# Patient Record
Sex: Male | Born: 1965 | Hispanic: Yes | Smoking: Never smoker
Health system: Southern US, Community
[De-identification: ages and names within clinical notes are randomized; demographics above are authoritative.]

## PROBLEM LIST (undated history)

## (undated) HISTORY — PX: APPENDECTOMY: SHX54

---

## 2001-11-03 DIAGNOSIS — K37 Unspecified appendicitis: Secondary | ICD-10-CM

## 2001-11-03 HISTORY — DX: Unspecified appendicitis: K37

## 2016-06-03 ENCOUNTER — Telehealth: Payer: Self-pay | Admitting: Cardiology

## 2016-06-03 NOTE — Telephone Encounter (Signed)
Encounter not needed

## 2016-06-03 NOTE — Telephone Encounter (Signed)
Allred is calling from [facility] ( [address]) needs clarification on the directions for  Generic Crestor . Please call °  °Thanks   ° °

## 2016-06-03 NOTE — Telephone Encounter (Signed)
No documentation that any provider in the system has seen this pt.  No medications listed, no appts scheduled or past. ? Correct pt

## 2016-06-13 ENCOUNTER — Emergency Department
Admission: EM | Admit: 2016-06-13 | Discharge: 2016-06-13 | Disposition: A | Discharge status: Home / Self Care | Payer: Worker's Compensation | Attending: Emergency Medicine | Admitting: Emergency Medicine

## 2016-06-13 ENCOUNTER — Emergency Department: Payer: Worker's Compensation

## 2016-06-13 ENCOUNTER — Encounter: Payer: Self-pay | Admitting: Emergency Medicine

## 2016-06-13 DIAGNOSIS — W010XXA Fall on same level from slipping, tripping and stumbling without subsequent striking against object, initial encounter: Secondary | ICD-10-CM | POA: Insufficient documentation

## 2016-06-13 DIAGNOSIS — Y929 Unspecified place or not applicable: Secondary | ICD-10-CM | POA: Diagnosis not present

## 2016-06-13 DIAGNOSIS — Y9389 Activity, other specified: Secondary | ICD-10-CM | POA: Insufficient documentation

## 2016-06-13 DIAGNOSIS — S52501A Unspecified fracture of the lower end of right radius, initial encounter for closed fracture: Secondary | ICD-10-CM

## 2016-06-13 DIAGNOSIS — S52591A Other fractures of lower end of right radius, initial encounter for closed fracture: Secondary | ICD-10-CM | POA: Insufficient documentation

## 2016-06-13 DIAGNOSIS — M25531 Pain in right wrist: Secondary | ICD-10-CM | POA: Diagnosis present

## 2016-06-13 DIAGNOSIS — Y990 Civilian activity done for income or pay: Secondary | ICD-10-CM | POA: Diagnosis not present

## 2016-06-13 MED ORDER — OXYCODONE-ACETAMINOPHEN 5-325 MG PO TABS: 1.0000 | ORAL_TABLET | Freq: Four times a day (QID) | ORAL | 0 refills | Status: DC | PRN

## 2016-06-13 MED ORDER — OXYCODONE-ACETAMINOPHEN 5-325 MG PO TABS
1.0000 | ORAL_TABLET | Freq: Once | ORAL | Status: AC
  Administered 2016-06-13: 1 via ORAL
  Filled 2016-06-13: qty 1

## 2016-06-13 NOTE — ED Triage Notes (Signed)
Patient presents to the ED with right wrist pain after slipping and falling on a ramp.  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  Denies hitting his head or any other injury.  Patient holding wrist during triage.

## 2016-06-13 NOTE — ED Provider Notes (Signed)
[facility] Emergency Department Provider Note  ____________________________________________  Time seen: Approximately 6:32 PM  I have reviewed the triage vital signs and the nursing notes.   HISTORY  Chief Complaint Wrist Pain    HPI Jared Foster is a 50 y.o. male who presents emergency department complaining of right wrist pain. The patient was at work when he slipped on the loading her into the truck fell off and landed on his right wrist. Patient reports pain to wrist. He denies any other injury. He did not hit his head or lose consciousness. Pain is sharp, constant, severe.   History reviewed. No pertinent past medical history.  There are no active problems to display for this patient.   No past surgical history on file.  Prior to Admission medications   Medication Sig Start Date End Date Taking? Authorizing Provider  oxyCODONE-acetaminophen (ROXICET) 5-325 MG tablet Take 1 tablet by mouth every 6 (six) hours as needed for severe pain. 06/13/16   Delorise Royals Deontre Allsup, PA-C    Allergies Review of patient's allergies indicates no known allergies.  No family history on file.  Social History Social History  Substance Use Topics  . Smoking status: Never Smoker  . Smokeless tobacco: Never Used  . Alcohol use No     Review of Systems  Constitutional: No fever/chills Cardiovascular: no chest pain. Respiratory: no cough. No SOB. Musculoskeletal: Negative for musculoskeletal pain. Skin: Negative for rash, abrasions, lacerations, ecchymosis. Neurological: Negative for headaches, focal weakness or numbness. 10-point ROS otherwise negative.  ____________________________________________   PHYSICAL EXAM:  VITAL SIGNS: ED Triage Vitals  Enc Vitals Group     BP 06/13/16 1824 (!) 158/105     Pulse Rate 06/13/16 1824 84     Resp 06/13/16 1824 18     Temp 06/13/16 1824 99.5 F (37.5 C)     Temp Source 06/13/16 1824 Oral     SpO2  06/13/16 1824 99 %     Weight 06/13/16 1825 230 lb (104.3 kg)     Height 06/13/16 1825  (1.778 m)     Head Circumference --      Peak Flow --      Pain Score 06/13/16 1825 9     Pain Loc --      Pain Edu? --      Excl. in GC? --      Constitutional: Alert and oriented. Well appearing and in no acute distress. Eyes: Conjunctivae are normal. PERRL. EOMI. Head: Atraumatic. Neck: No stridor.  No cervical spine tenderness to palpation  Cardiovascular: Normal rate, regular rhythm. Normal S1 and S2.  Good peripheral circulation. Respiratory: Normal respiratory effort without tachypnea or retractions. Lungs CTAB. Good air entry to the bases with no decreased or absent breath sounds. Musculoskeletal: Limited range of motion to right upper arm. No visible deformity. Mild edema noted over the distal radius and ulna. Patient is extremely tender to palpation over the distal ulna and radius. Full range of motion 5 digits of the right hand. Sensation intact 5 digits. Radial pulse and cap refill intact to right upper extremity. Neurologic:  Normal speech and language. No gross focal neurologic deficits are appreciated.  Skin:  Skin is warm, dry and intact. No rash noted. Psychiatric: Mood and affect are normal. Speech and behavior are normal. Patient exhibits appropriate insight and judgement.   ____________________________________________   LABS (all labs ordered are listed, but only abnormal results are displayed)  Labs Reviewed - No data  to display ____________________________________________  EKG   ____________________________________________  RADIOLOGY Festus BarrenI, Oshen Wlodarczyk D Zalen Sequeira, personally viewed and evaluated these images (plain radiographs) as part of my medical decision making, as well as reviewing the written report by the radiologist.  Dg Wrist Complete Right  Result Date: 06/13/2016 CLINICAL DATA:  Patient presents to the ED with right wrist pain after slipping and falling  on a ramp. 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 EXAM: RIGHT WRIST - COMPLETE 3+ VIEW COMPARISON:  None. FINDINGS: There is a minimally displaced oblique fracture of the distal radius, extending to the articular surface. No significant angulation. The distal ulna is intact. Intercarpal spaces are normal. IMPRESSION: Fracture the distal radius. Electronically Signed   By: Norva PavlovElizabeth  Brown M.D.   On: 06/13/2016 18:53    ____________________________________________    PROCEDURES  Procedure(s) performed:    .Splint Application Date/Time: 06/13/2016 7:07 PM Performed by: Gala RomneyUTHRIELL, Mariana Wiederholt D Authorized by: Gala RomneyUTHRIELL, Arnella Pralle D   Consent:    Consent obtained:  Verbal   Consent given by:  Patient   Risks discussed:  Numbness, pain, swelling and discoloration Pre-procedure details:    Sensation:  Normal Procedure details:    Laterality:  Right   Location:  Wrist   Wrist:  R wrist   Cast type:  Short arm   Splint type:  Wrist   Supplies:  Ortho-Glass, sling and elastic bandage Post-procedure details:    Pain:  Unchanged   Sensation:  Normal   Patient tolerance of procedure:  Tolerated well, no immediate complications      Medications  oxyCODONE-acetaminophen (PERCOCET/ROXICET) 5-325 MG per tablet 1 tablet (1 tablet Oral Given 06/13/16 1852)     ____________________________________________   INITIAL IMPRESSION / ASSESSMENT AND PLAN / ED COURSE  Pertinent labs & imaging results that were available during my care of the patient were reviewed by me and considered in my medical decision making (see chart for details).  Clinical Course    Patient's diagnosis is consistent with Distal radial fracture. X-ray reveals the above diagnosis. Exam is reassuring with good sensation and pulses.. Patient will be discharged home with prescriptions for pain medication. Patient is to follow up with orthopedics as needed or  otherwise directed. Patient is given ED precautions to return to the ED for any worsening or new symptoms.     ____________________________________________  FINAL CLINICAL IMPRESSION(S) / ED DIAGNOSES  Final diagnoses:  Distal radius fracture, right, closed, initial encounter      NEW MEDICATIONS STARTED DURING THIS VISIT:  New Prescriptions   OXYCODONE-ACETAMINOPHEN (ROXICET) 5-325 MG TABLET    Take 1 tablet by mouth every 6 (six) hours as needed for severe pain.        This chart was dictated using voice recognition software/Dragon. Despite best efforts to proofread, errors can occur which can change the meaning. Any change was purely unintentional.    Racheal PatchesJonathan D Dekendrick Uzelac, PA-C 06/13/16 1912    Myrna Blazeravid Matthew Schaevitz, MD 06/13/16 2106

## 2017-06-02 ENCOUNTER — Encounter: Payer: Self-pay | Admitting: Emergency Medicine

## 2017-06-02 ENCOUNTER — Emergency Department: Payer: Self-pay

## 2017-06-02 ENCOUNTER — Emergency Department
Admission: EM | Admit: 2017-06-02 | Discharge: 2017-06-02 | Disposition: A | Discharge status: Home / Self Care | Payer: Self-pay | Attending: Emergency Medicine | Admitting: Emergency Medicine

## 2017-06-02 DIAGNOSIS — R109 Unspecified abdominal pain: Secondary | ICD-10-CM

## 2017-06-02 DIAGNOSIS — R1032 Left lower quadrant pain: Secondary | ICD-10-CM | POA: Insufficient documentation

## 2017-06-02 LAB — URINALYSIS, COMPLETE (UACMP) WITH MICROSCOPIC
Bacteria, UA: NONE SEEN
Bilirubin Urine: NEGATIVE
Glucose, UA: NEGATIVE mg/dL
HGB URINE DIPSTICK: NEGATIVE
KETONES UR: NEGATIVE mg/dL
Leukocytes, UA: NEGATIVE
Nitrite: NEGATIVE
Protein, ur: NEGATIVE mg/dL
SQUAMOUS EPITHELIAL / LPF: NONE SEEN
Specific Gravity, Urine: 1.019 (ref 1.005–1.030)
pH: 6 (ref 5.0–8.0)

## 2017-06-02 LAB — CBC
HCT: 40 % (ref 40.0–52.0)
Hemoglobin: 14 g/dL (ref 13.0–18.0)
MCH: 30.1 pg (ref 26.0–34.0)
MCHC: 35 g/dL (ref 32.0–36.0)
MCV: 85.9 fL (ref 80.0–100.0)
PLATELETS: 225 10*3/uL (ref 150–440)
RBC: 4.66 MIL/uL (ref 4.40–5.90)
RDW: 12.8 % (ref 11.5–14.5)
WBC: 7.3 10*3/uL (ref 3.8–10.6)

## 2017-06-02 LAB — COMPREHENSIVE METABOLIC PANEL
ALBUMIN: 4 g/dL (ref 3.5–5.0)
ALK PHOS: 73 U/L (ref 38–126)
ALT: 65 U/L — AB (ref 17–63)
AST: 54 U/L — AB (ref 15–41)
Anion gap: 9 (ref 5–15)
BUN: 28 mg/dL — AB (ref 6–20)
CALCIUM: 9.6 mg/dL (ref 8.9–10.3)
CHLORIDE: 106 mmol/L (ref 101–111)
CO2: 24 mmol/L (ref 22–32)
CREATININE: 0.8 mg/dL (ref 0.61–1.24)
GFR calc Af Amer: >60 mL/min (ref >60)
GFR calc non Af Amer: >60 mL/min (ref >60)
Glucose, Bld: 180 mg/dL — ABNORMAL HIGH (ref 65–99)
Potassium: 3.8 mmol/L (ref 3.5–5.1)
SODIUM: 139 mmol/L (ref 135–145)
Total Bilirubin: 0.4 mg/dL (ref 0.3–1.2)
Total Protein: 7.4 g/dL (ref 6.5–8.1)

## 2017-06-02 LAB — PROCALCITONIN

## 2017-06-02 LAB — LIPASE, BLOOD: LIPASE: 71 U/L — AB (ref 11–51)

## 2017-06-02 LAB — LACTIC ACID, PLASMA: LACTIC ACID, VENOUS: 1.6 mmol/L (ref 0.5–1.9)

## 2017-06-02 MED ORDER — DICYCLOMINE HCL 10 MG PO CAPS
10.0000 mg | ORAL_CAPSULE | Freq: Once | ORAL | Status: AC
  Administered 2017-06-02: 10 mg via ORAL
  Filled 2017-06-02: qty 1

## 2017-06-02 MED ORDER — SODIUM CHLORIDE 0.9 % IV BOLUS (SEPSIS)
1000.0000 mL | Freq: Once | INTRAVENOUS | Status: AC
  Administered 2017-06-02: 1000 mL via INTRAVENOUS

## 2017-06-02 MED ORDER — FAMOTIDINE 40 MG PO TABS: 40.0000 mg | ORAL_TABLET | Freq: Every evening | ORAL | 1 refills | Status: DC

## 2017-06-02 MED ORDER — SUCRALFATE 1 G PO TABS: 1.0000 g | ORAL_TABLET | Freq: Four times a day (QID) | ORAL | 0 refills | Status: DC

## 2017-06-02 MED ORDER — DICYCLOMINE HCL 20 MG PO TABS: 20.0000 mg | ORAL_TABLET | Freq: Three times a day (TID) | ORAL | 0 refills | Status: DC | PRN

## 2017-06-02 MED ORDER — IBUPROFEN 600 MG PO TABS
600.0000 mg | ORAL_TABLET | Freq: Once | ORAL | Status: AC
  Administered 2017-06-02: 600 mg via ORAL
  Filled 2017-06-02: qty 1

## 2017-06-02 MED ORDER — IOPAMIDOL (ISOVUE-300) INJECTION 61%
100.0000 mL | Freq: Once | INTRAVENOUS | Status: AC | PRN
  Administered 2017-06-02: 100 mL via INTRAVENOUS

## 2017-06-02 MED ORDER — GI COCKTAIL ~~LOC~~
30.0000 mL | Freq: Once | ORAL | Status: AC
  Administered 2017-06-02: 30 mL via ORAL
  Filled 2017-06-02: qty 30

## 2017-06-02 MED ORDER — OXYCODONE-ACETAMINOPHEN 5-325 MG PO TABS
1.0000 | ORAL_TABLET | Freq: Once | ORAL | Status: AC
  Administered 2017-06-02: 1 via ORAL
  Filled 2017-06-02: qty 1

## 2017-06-02 NOTE — ED Provider Notes (Signed)
[facility] Emergency Department Provider Note  ____________________________________________   First MD Initiated Contact with Patient 06/02/17 1924     (approximate)  I have reviewed the triage vital signs and the nursing notes.   HISTORY  Chief Complaint Abdominal Pain   HPI Jared Foster is a 51 y.o. male who comes to the emergency department with 5 days of intermittent left lower quadrant pain. Pain is moderate to severe and comes in waves. When it comes it lasts several minutes at a time but always has a baseline level. He is a remote surgical history of appendectomy. He takes no medications. He has subjective fever and chills. He's had some nausea but no vomiting. No diarrhea. She feels like eating makes his symptoms worse.   History reviewed. No pertinent past medical history.  There are no active problems to display for this patient.   History reviewed. No pertinent surgical history.  Prior to Admission medications   Medication Sig Start Date End Date Taking? Authorizing Provider  oxyCODONE-acetaminophen (ROXICET) 5-325 MG tablet Take 1 tablet by mouth every 6 (six) hours as needed for severe pain. 06/13/16   Cuthriell, Delorise RoyalsJonathan D, PA-C    Allergies Patient has no known allergies.  History reviewed. No pertinent family history.  Social History Social History  Substance Use Topics  . Smoking status: Never Smoker  . Smokeless tobacco: Never Used  . Alcohol use No    Review of Systems Constitutional: No fever/chills Eyes: No visual changes. ENT: No sore throat. Cardiovascular: Denies chest pain. Respiratory: Denies shortness of breath. Gastrointestinal: Positive abdominal pain.  Positive nausea, negative vomiting.  No diarrhea.  No constipation. Genitourinary: Negative for dysuria. Musculoskeletal: Negative for back pain. Skin: Negative for rash. Neurological: Negative for headaches, focal weakness or  numbness.   ____________________________________________   PHYSICAL EXAM:  VITAL SIGNS: ED Triage Vitals [06/02/17 1903]  Enc Vitals Group     BP (!) 157/85     Pulse Rate (!) 114     Resp 18     Temp 99.6 F (37.6 C)     Temp Source Oral     SpO2 99 %     Weight 220 lb (99.8 kg)     Height 5\' 10"  (1.778 m)     Head Circumference      Peak Flow      Pain Score 8     Pain Loc      Pain Edu?      Excl. in GC?     Constitutional: Alert and oriented 4 appropriate cooperative speaks in full clear sentences no diaphoresis Eyes: PERRL EOMI. Head: Atraumatic. Nose: No congestion/rhinnorhea. Mouth/Throat: No trismus Neck: No stridor.   Cardiovascular: Tachycardic rate, regular rhythm. Grossly normal heart sounds.  Good peripheral circulation. Respiratory: Normal respiratory effort.  No retractions. Lungs CTAB and moving good air Gastrointestinal: Soft nondistended moderate tenderness left lower quadrant with no rebound or guarding no peritonitis negative Murphy's to vertebral tenderness Musculoskeletal: No lower extremity edema   Neurologic:  Normal speech and language. No gross focal neurologic deficits are appreciated. Skin:  Skin is warm, dry and intact. No rash noted. Psychiatric: Mood and affect are normal. Speech and behavior are normal.    ____________________________________________   DIFFERENTIAL includes but not limited to diverticulitis, cholecystitis, biliary colic, cholangitis, small bowel obstruction ____________________________________________   LABS (all labs ordered are listed, but only abnormal results are displayed)  Labs Reviewed  URINALYSIS, COMPLETE (UACMP) WITH MICROSCOPIC - Abnormal;  Notable for the following:       Result Value   Color, Urine YELLOW (*)    APPearance CLEAR (*)    All other components within normal limits  CULTURE, BLOOD (ROUTINE X 2)  CULTURE, BLOOD (ROUTINE X 2)  CBC  LIPASE, BLOOD  COMPREHENSIVE METABOLIC PANEL  LACTIC  ACID, PLASMA  LACTIC ACID, PLASMA  PROCALCITONIN    Normal white count and no evidence of urinary tract infection  EKG  ED ECG REPORT I, Merrily BrittleNeil Hugo Lybrand, the attending physician, personally viewed and interpreted this ECG.  Date: 06/02/2017 Rate: 111 Rhythm: Sinus tachycardia QRS Axis: normal Intervals: normal ST/T Wave abnormalities: normal Narrative Interpretation: Incomplete right bundle branch block sinus tachycardia no signs of acute ischemia abnormal EKG  ____________________________________________  RADIOLOGY   ____________________________________________   PROCEDURES  Procedure(s) performed: no  Procedures  Critical Care performed: no  Observation: no ____________________________________________   INITIAL IMPRESSION / ASSESSMENT AND PLAN / ED COURSE  Pertinent labs & imaging results that were available during my care of the patient were reviewed by me and considered in my medical decision making (see chart for details).  On arrival the patient is well-appearing although he does have a tender abdomen is tachycardic with a low-grade fever. Performed a bedside ultrasound which showed a normal gallbladder with no stones. At this point I'm concerned about diverticulitis and we'll start a line hydrate him and she will require CT abdomen pelvis with IV contrast.      ____________________________________________   FINAL CLINICAL IMPRESSION(S) / ED DIAGNOSES  Final diagnoses:  Left lower quadrant pain      NEW MEDICATIONS STARTED DURING THIS VISIT:  New Prescriptions   No medications on file     Note:  This document was prepared using Dragon voice recognition software and may include unintentional dictation errors.     Merrily Brittleifenbark, Kalen Ratajczak, MD 06/02/17 2012

## 2017-06-02 NOTE — ED Provider Notes (Signed)
-----------------------------------------   10:50 PM on 06/02/2017 -----------------------------------------  CT scan without any acute findings. Did show some thickening of the colon which I discussed with the patient the possibility of cancer and importance of GI follow up. Patient's pain did improve after bentyl and GI cocktail. Will give prescription for antacid, bentyl and sucralfate.    Phineas SemenGoodman, Lakeesha Fontanilla, MD 06/02/17 [phone]

## 2017-06-02 NOTE — Discharge Instructions (Signed)
As we discussed please see a GI doctor to have a colonoscopy performed to evaluate the thickening of the colon seen on CT scan. As we discussed there is a possibility of cancer. Please seek medical attention for any high fevers, chest pain, shortness of breath, change in behavior, persistent vomiting, bloody stool or any other new or concerning symptoms.

## 2017-06-02 NOTE — ED Triage Notes (Signed)
Pt c/o upper abdominal pain and subjective fever X 3 days.  Denies NVD.  NAD. Skin warm and dry. Respirations unlabored.  Ambulatory to triage.  No urinary sx.  Pain worse when pushes on stomach per pt.

## 2017-06-07 LAB — CULTURE, BLOOD (ROUTINE X 2)
CULTURE: NO GROWTH
Culture: NO GROWTH
Special Requests: ADEQUATE

## 2017-07-01 ENCOUNTER — Ambulatory Visit: Payer: Self-pay | Admitting: Gastroenterology

## 2017-07-07 ENCOUNTER — Ambulatory Visit: Payer: Self-pay | Admitting: Gastroenterology

## 2017-08-25 ENCOUNTER — Encounter (INDEPENDENT_AMBULATORY_CARE_PROVIDER_SITE_OTHER): Payer: Self-pay

## 2017-08-25 ENCOUNTER — Encounter: Payer: Self-pay | Admitting: Gastroenterology

## 2017-08-25 ENCOUNTER — Ambulatory Visit (INDEPENDENT_AMBULATORY_CARE_PROVIDER_SITE_OTHER): Payer: Self-pay | Admitting: Gastroenterology

## 2017-08-25 VITALS — BP 145/90 | HR 99 | Temp 97.4°F | Wt 229.8 lb

## 2017-08-25 DIAGNOSIS — K629 Disease of anus and rectum, unspecified: Secondary | ICD-10-CM

## 2017-08-25 DIAGNOSIS — K625 Hemorrhage of anus and rectum: Secondary | ICD-10-CM

## 2017-08-25 NOTE — Progress Notes (Signed)
Wyline MoodKiran Laneshia Pina MD, MRCP(U.K) [address]  [address]  [place], [place] [address]  Main: [phone]  Fax: [phone]   Gastroenterology Consultation  Referring Provider:     ER  Primary Care Physician:  Patient, No Pcp Per Primary Gastroenterologist:  Dr. [name]  Reason for Consultation:     Abdominal pain         HPI:   Jared Foster is a 51 y.o. y/o male  Who presented to the Er on 06/02/17 with abdominal pain . He undewent a CT scan of the abdomen which showed some thickening of the rectum . Fatty liver, small left inguinal hernia. He was discharged with outpatient GI follow up .Hb 14.0 .   He speaks Spanish and here with interpretor. Denies any abdominal pain at all since the ER visit. He has never had a colonoscopy . No family history colon cancer or polyps. He has noticed rectal bleeding, describes it as blood on the paper. He  Has a bowel movement 2-3 times a day , hard and soft in nature. Noticed change in the shape of his stool recently -thinner. Denies any weight loss. Denies any blood thinners.   No regular use of NSAID's.  History reviewed. No pertinent past medical history.  History reviewed. No pertinent surgical history.  Prior to Admission medications   Not on File    History reviewed. No pertinent family history.   Social History  Substance Use Topics  . Smoking status: Never Smoker  . Smokeless tobacco: Never Used  . Alcohol use No    Allergies as of 08/25/2017  . (No Known Allergies)    Review of Systems:    All systems reviewed and negative except where noted in HPI.   Physical Exam:  BP (!) 145/90 (BP Location: Right Arm, Patient Position: Sitting, Cuff Size: Large)   Pulse 99   Temp (!) 97.4 F (36.3 C) (Oral)   Wt 229 lb 12.8 oz (104.2 kg)   BMI 32.97 kg/m  No LMP for male patient. Psych:  Alert and cooperative. Normal mood and affect. General:   Alert,  Well-developed, well-nourished, pleasant and cooperative in  NAD Head:  Normocephalic and atraumatic. Eyes:  Sclera clear, no icterus.   Conjunctiva pink. Ears:  Normal auditory acuity. Nose:  No deformity, discharge, or lesions. Mouth:  No deformity or lesions,oropharynx pink & moist. Neck:  Supple; no masses or thyromegaly. Lungs:  Respirations even and unlabored.  Clear throughout to auscultation.   No wheezes, crackles, or rhonchi. No acute distress. Heart:  Regular rate and rhythm; no murmurs, clicks, rubs, or gallops. Abdomen:  Normal bowel sounds.  No bruits.  Soft, non-tender and non-distended without masses, hepatosplenomegaly or hernias noted.  No guarding or rebound tenderness.    Neurologic:  Alert and oriented x3;  grossly normal neurologically. Skin:  Intact without significant lesions or rashes. No jaundice. Lymph Nodes:  No significant cervical adenopathy. Psych:  Alert and cooperative. Normal mood and affect.  Imaging Studies: No results found.  Assessment and Plan:   Jared Foster is a 51 y.o. y/o male has been referred for abnormal CT scan of the abdomen , he has thcikening seen on the Rectum , h/o on and off rectal bleeding. Never had a colonoscopy .  Plan  1. Diagnostic colonoscopy    I have discussed alternative options, risks & benefits,  which include, but are not limited to, bleeding, infection, perforation,respiratory complication & drug reaction.  The  patient agrees with this plan & written consent will be obtained.    Follow up in PRN  Dr Wyline Mood MD,MRCP(U.K)

## 2017-08-26 NOTE — Addendum Note (Signed)
Addended by: Ardyth ManARTER, Jeanne Diefendorf Z on: 08/26/2017 08:58 AM   Modules accepted: Orders, SmartSet

## 2017-08-31 ENCOUNTER — Encounter: Payer: Self-pay | Admitting: *Deleted

## 2017-09-01 ENCOUNTER — Encounter: Payer: Self-pay | Admitting: *Deleted

## 2017-09-01 ENCOUNTER — Encounter
Admission: RE | Disposition: A | Discharge status: Home / Self Care | Payer: Self-pay | Source: Ambulatory Visit | Attending: Gastroenterology

## 2017-09-01 ENCOUNTER — Ambulatory Visit: Payer: Self-pay | Admitting: Anesthesiology

## 2017-09-01 ENCOUNTER — Ambulatory Visit
Admission: RE | Admit: 2017-09-01 | Discharge: 2017-09-01 | Disposition: A | Discharge status: Home / Self Care | Payer: Self-pay | Source: Ambulatory Visit | Attending: Gastroenterology | Admitting: Gastroenterology

## 2017-09-01 DIAGNOSIS — K625 Hemorrhage of anus and rectum: Secondary | ICD-10-CM

## 2017-09-01 DIAGNOSIS — K640 First degree hemorrhoids: Secondary | ICD-10-CM | POA: Insufficient documentation

## 2017-09-01 DIAGNOSIS — K639 Disease of intestine, unspecified: Secondary | ICD-10-CM

## 2017-09-01 DIAGNOSIS — K629 Disease of anus and rectum, unspecified: Secondary | ICD-10-CM

## 2017-09-01 DIAGNOSIS — K529 Noninfective gastroenteritis and colitis, unspecified: Secondary | ICD-10-CM | POA: Insufficient documentation

## 2017-09-01 HISTORY — PX: COLONOSCOPY WITH PROPOFOL: SHX5780

## 2017-09-01 SURGERY — COLONOSCOPY WITH PROPOFOL
Anesthesia: General

## 2017-09-01 MED ORDER — LIDOCAINE HCL (PF) 2 % IJ SOLN
INTRAMUSCULAR | Status: AC
  Filled 2017-09-01: qty 10

## 2017-09-01 MED ORDER — LIDOCAINE HCL (CARDIAC) 20 MG/ML IV SOLN
INTRAVENOUS | Status: DC | PRN
  Administered 2017-09-01: 60 mg via INTRAVENOUS

## 2017-09-01 MED ORDER — PROPOFOL 10 MG/ML IV BOLUS
INTRAVENOUS | Status: DC | PRN
  Administered 2017-09-01: 60 mg via INTRAVENOUS

## 2017-09-01 MED ORDER — PROPOFOL 500 MG/50ML IV EMUL
INTRAVENOUS | Status: DC | PRN
  Administered 2017-09-01: 175 ug/kg/min via INTRAVENOUS

## 2017-09-01 MED ORDER — MIDAZOLAM HCL 2 MG/2ML IJ SOLN
INTRAMUSCULAR | Status: DC | PRN
  Administered 2017-09-01: 2 mg via INTRAVENOUS

## 2017-09-01 MED ORDER — PROPOFOL 500 MG/50ML IV EMUL
INTRAVENOUS | Status: AC
  Filled 2017-09-01: qty 50

## 2017-09-01 MED ORDER — MIDAZOLAM HCL 2 MG/2ML IJ SOLN
INTRAMUSCULAR | Status: AC
  Filled 2017-09-01: qty 2

## 2017-09-01 MED ORDER — SODIUM CHLORIDE 0.9 % IV SOLN
INTRAVENOUS | Status: DC
  Administered 2017-09-01 (×3): via INTRAVENOUS

## 2017-09-01 NOTE — Op Note (Addendum)
[facility] Patient Name: Jared Foster Procedure Date: 09/01/2017 8:52 AM MRN: [id number] Account #: [id number] Date of Birth: [birth year] Admit Type: Outpatient Age: 51 Room: [facility] ENDO ROOM 1 Gender: Male Note Status: Finalized Procedure:            Colonoscopy Indications:          Anal bleeding, Abnormal CT of the GI tract Providers:            Wyline Mood MD, MD Referring MD:         No Local Md, MD (Referring MD) Medicines:            Monitored Anesthesia Care Complications:        No immediate complications. Procedure:            Pre-Anesthesia Assessment:                       - Prior to the procedure, a History and Physical was                        performed, and patient medications, allergies and                        sensitivities were reviewed. The patient's tolerance of                        previous anesthesia was reviewed.                       - The risks and benefits of the procedure and the                        sedation options and risks were discussed with the                        patient. All questions were answered and informed                        consent was obtained.                       - ASA Grade Assessment: I - A normal, healthy patient.                       After obtaining informed consent, the colonoscope was                        passed under direct vision. Throughout the procedure,                        the patient's blood pressure, pulse, and oxygen                        saturations were monitored continuously. The Olympus                        CF-H180AL colonoscope ( S#: [id number] ) was introduced                        through the anus and advanced to the the cecum,  identified by the appendiceal orifice, IC valve and                        transillumination. The colonoscopy was performed with                        ease. The patient tolerated the procedure well.  The                        quality of the bowel preparation was good. Findings:      The perianal and digital rectal examinations were normal.      Non-bleeding internal hemorrhoids were found during retroflexion. The       hemorrhoids were medium-sized and Grade I (internal hemorrhoids that do       not prolapse).      Localized mild inflammation characterized by erythema was found in the       ascending colon. Biopsies were taken with a cold forceps for histology.      The exam was otherwise without abnormality on direct and retroflexion       views.      Normal mucosa was found in the rectum. Impression:           - Non-bleeding internal hemorrhoids.                       - Localized mild inflammation was found in the                        ascending colon. Biopsied.                       - The examination was otherwise normal on direct and                        retroflexion views. Recommendation:       - Discharge patient to home (with escort).                       - Resume previous diet.                       - Continue present medications.                       - Await pathology results.                       - Repeat colonoscopy for surveillance based on                        pathology results. Procedure Code(s):    --- Professional ---                       [phone], Colonoscopy, flexible; with biopsy, single or                        multiple Diagnosis Code(s):    --- Professional ---                       K52.9, Noninfective gastroenteritis and colitis,  unspecified                       K64.0, First degree hemorrhoids                       R93.3, Abnormal findings on diagnostic imaging of other                        parts of digestive tract                       K62.5, Hemorrhage of anus and rectum CPT copyright 2016 American Medical Association. All rights reserved. The codes documented in this report are preliminary and upon coder review may  be  revised to meet current compliance requirements. Wyline Mood, MD Wyline Mood MD, MD 09/01/2017 9:20:15 AM This report has been signed electronically. Number of Addenda: 0 Note Initiated On: 09/01/2017 8:52 AM Scope Withdrawal Time: 0 hours 14 minutes 14 seconds  Total Procedure Duration: 0 hours 17 minutes 36 seconds       [facility]

## 2017-09-01 NOTE — Anesthesia Postprocedure Evaluation (Signed)
Anesthesia Post Note  Patient: Jared Foster  Procedure(s) Performed: COLONOSCOPY WITH PROPOFOL (N/A )  Patient location during evaluation: Endoscopy Anesthesia Type: General Level of consciousness: awake and alert Pain management: pain level controlled Vital Signs Assessment: post-procedure vital signs reviewed and stable Respiratory status: spontaneous breathing and respiratory function stable Cardiovascular status: stable Anesthetic complications: no     Last Vitals:  Vitals:   09/01/17 0920 09/01/17 0930  BP: (!) 108/59 115/81  Pulse: 95 91  Resp: (!) 21 18  Temp: 36.6 C   SpO2: 99% 98%    Last Pain:  Vitals:   09/01/17 0930  TempSrc:   PainSc: 0-No pain                 Veria Stradley K

## 2017-09-01 NOTE — Progress Notes (Signed)
Daughter is at the bedside at this time.

## 2017-09-01 NOTE — H&P (Signed)
           Wyline MoodKiran Katalia Choma, MD [address], [address], [place], [place], [id number] [address], [address], [place], [place], [address] Phone: [phone]  Fax: [phone]  Primary Care Physician:  Patient, No Pcp Per   Pre-Procedure History & Physical: HPI:  Jared Foster is a 51 y.o. male is here for an colonoscopy.   History reviewed. No pertinent past medical history.  Past Surgical History:  Procedure Laterality Date  . APPENDECTOMY      Prior to Admission medications   Not on File    Allergies as of 08/26/2017  . (No Known Allergies)    History reviewed. No pertinent family history.  Social History   Social History  . Marital status: Single    Spouse name: N/A  . Number of children: N/A  . Years of education: N/A   Occupational History  . Not on file.   Social History Main Topics  . Smoking status: Never Smoker  . Smokeless tobacco: Never Used  . Alcohol use No  . Drug use: No  . Sexual activity: Yes   Other Topics Concern  . Not on file   Social History Narrative  . No narrative on file    Review of Systems: See HPI, otherwise negative ROS  Physical Exam: BP (!) 144/84   Pulse 86   Temp (!) 95.5 F (35.3 C) (Tympanic)   Resp 18   Ht 5\' 10"  (1.778 m)   Wt 230 lb (104.3 kg)   SpO2 100%   BMI 33.00 kg/m  General:   Alert,  pleasant and cooperative in NAD Head:  Normocephalic and atraumatic. Neck:  Supple; no masses or thyromegaly. Lungs:  Clear throughout to auscultation, normal respiratory effort.    Heart:  +S1, +S2, Regular rate and rhythm, No edema. Abdomen:  Soft, nontender and nondistended. Normal bowel sounds, without guarding, and without rebound.   Neurologic:  Alert and  oriented x4;  grossly normal neurologically.  Impression/Plan: Jared Foster is here for an colonoscopy to be performed  Rectal bleeding and abnormal CT scan of the abdomen. Consent via hospital interpretor. Risks, benefits,  limitations, and alternatives regarding  colonoscopy have been reviewed with the patient.  Questions have been answered.  All parties agreeable.   Wyline MoodKiran Ofelia Podolski, MD  09/01/2017, 8:51 AM

## 2017-09-01 NOTE — Transfer of Care (Signed)
Immediate Anesthesia Transfer of Care Note  Patient: Jared Foster  Procedure(s) Performed: COLONOSCOPY WITH PROPOFOL (N/A )  Patient Location: Endoscopy Unit  Anesthesia Type:General  Level of Consciousness: drowsy and patient cooperative  Airway & Oxygen Therapy: Patient Spontanous Breathing and Patient connected to nasal cannula oxygen  Post-op Assessment: Report given to RN and Post -op Vital signs reviewed and stable  Post vital signs: Reviewed and stable  Last Vitals:  Vitals:   09/01/17 0830  BP: (!) 144/84  Pulse: 86  Resp: 18  Temp: (!) 35.3 C  SpO2: 100%    Last Pain:  Vitals:   09/01/17 0830  TempSrc: Tympanic         Complications: No apparent anesthesia complications

## 2017-09-01 NOTE — Anesthesia Preprocedure Evaluation (Signed)
Anesthesia Evaluation  Patient identified by MRN, date of birth, ID band Patient awake    Reviewed: Allergy & Precautions, NPO status , Patient's Chart, lab work & pertinent test results  History of Anesthesia Complications Negative for: history of anesthetic complications  Airway Mallampati: III       Dental   Pulmonary neg sleep apnea, neg COPD,           Cardiovascular (-) hypertension(-) Past MI and (-) CHF (-) dysrhythmias (-) Valvular Problems/Murmurs     Neuro/Psych neg Seizures    GI/Hepatic Neg liver ROS, neg GERD  ,  Endo/Other  neg diabetes  Renal/GU negative Renal ROS     Musculoskeletal   Abdominal   Peds  Hematology   Anesthesia Other Findings   Reproductive/Obstetrics                             Anesthesia Physical Anesthesia Plan  ASA: I  Anesthesia Plan: General   Post-op Pain Management:    Induction: Intravenous  PONV Risk Score and Plan: Propofol infusion  Airway Management Planned: Nasal Cannula  Additional Equipment:   Intra-op Plan:   Post-operative Plan:   Informed Consent: I have reviewed the patients History and Physical, chart, labs and discussed the procedure including the risks, benefits and alternatives for the proposed anesthesia with the patient or authorized representative who has indicated his/her understanding and acceptance.     Plan Discussed with:   Anesthesia Plan Comments:         Anesthesia Quick Evaluation

## 2017-09-01 NOTE — Anesthesia Post-op Follow-up Note (Signed)
Anesthesia QCDR form completed.        

## 2017-09-02 ENCOUNTER — Encounter: Payer: Self-pay | Admitting: Gastroenterology

## 2017-09-02 LAB — SURGICAL PATHOLOGY

## 2017-09-15 ENCOUNTER — Telehealth: Payer: Self-pay

## 2017-09-15 NOTE — Telephone Encounter (Signed)
-----   Message from Wyline MoodKiran [name], MD sent at 09/15/2017 12:17 PM EST ----- Inform biopsies show some acute inflammation usually seen when taking NSAID's or after an infection. If not having any symptoms needs no further evaluation if having symptoms come back to see me. Repeat colonoscopy in 5 years if has family history of colon cancer or polyps otherwise return in 10 years

## 2017-09-15 NOTE — Telephone Encounter (Signed)
Jared Foster advised patient of results per Dr. Tobi BastosAnna.   Inform biopsies show some acute inflammation usually seen when taking NSAID's or after an infection. If not having any symptoms needs no further evaluation if having symptoms come back to see me. Repeat colonoscopy in 5 years if has family history of colon cancer or polyps otherwise return in 10 years

## 2018-12-22 ENCOUNTER — Emergency Department
Admission: EM | Admit: 2018-12-22 | Discharge: 2018-12-22 | Disposition: A | Discharge status: Home / Self Care | Payer: Self-pay | Attending: Emergency Medicine | Admitting: Emergency Medicine

## 2018-12-22 ENCOUNTER — Encounter: Payer: Self-pay | Admitting: *Deleted

## 2018-12-22 ENCOUNTER — Other Ambulatory Visit: Payer: Self-pay

## 2018-12-22 DIAGNOSIS — Y999 Unspecified external cause status: Secondary | ICD-10-CM | POA: Insufficient documentation

## 2018-12-22 DIAGNOSIS — Y939 Activity, unspecified: Secondary | ICD-10-CM | POA: Insufficient documentation

## 2018-12-22 DIAGNOSIS — Y929 Unspecified place or not applicable: Secondary | ICD-10-CM | POA: Insufficient documentation

## 2018-12-22 DIAGNOSIS — X58XXXA Exposure to other specified factors, initial encounter: Secondary | ICD-10-CM | POA: Insufficient documentation

## 2018-12-22 DIAGNOSIS — T148XXA Other injury of unspecified body region, initial encounter: Secondary | ICD-10-CM

## 2018-12-22 DIAGNOSIS — S29012A Strain of muscle and tendon of back wall of thorax, initial encounter: Secondary | ICD-10-CM | POA: Insufficient documentation

## 2018-12-22 MED ORDER — CYCLOBENZAPRINE HCL 10 MG PO TABS: 10.0000 mg | ORAL_TABLET | Freq: Three times a day (TID) | ORAL | 0 refills | Status: DC | PRN

## 2018-12-22 MED ORDER — LIDOCAINE 5 % EX PTCH: 1.0000 | MEDICATED_PATCH | Freq: Two times a day (BID) | CUTANEOUS | 0 refills | Status: AC

## 2018-12-22 MED ORDER — LIDOCAINE 5 % EX PTCH
1.0000 | MEDICATED_PATCH | CUTANEOUS | Status: DC
  Administered 2018-12-22: 1 via TRANSDERMAL
  Filled 2018-12-22: qty 1

## 2018-12-22 MED ORDER — OXYCODONE-ACETAMINOPHEN 7.5-325 MG PO TABS: 1.0000 | ORAL_TABLET | Freq: Four times a day (QID) | ORAL | 0 refills | Status: DC | PRN

## 2018-12-22 NOTE — ED Triage Notes (Signed)
Pt to ED reporting back pain x 3 days. Pain is localized to the upper back. No injury. Movement makes the pain worse, specifically moving from sitting to standing.

## 2018-12-22 NOTE — ED Provider Notes (Signed)
[facility] Emergency Department Provider Note   ____________________________________________   First MD Initiated Contact with Patient 12/22/18 1357     (approximate)  I have reviewed the triage vital signs and the nursing notes.   HISTORY  Chief Complaint Back Pain    HPI via interpreter Jared Foster is a 53 y.o. male patient presents with 3 days of low back pain right greater than left.  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  Patient state movement of the upper extremities and flex the neck increases the mid upper back pain.  Patient denies loss of sensation.  Patient rates the pain as a 10/10.  Patient described the pain as "achy".  No palliative measure for complaint.  History reviewed. No pertinent past medical history.  There are no active problems to display for this patient.   Past Surgical History:  Procedure Laterality Date  . APPENDECTOMY    . COLONOSCOPY WITH PROPOFOL N/A 09/01/2017   Procedure: COLONOSCOPY WITH PROPOFOL;  Surgeon: Wyline Mood, MD;  Location: [facility] ENDOSCOPY;  Service: Gastroenterology;  Laterality: N/A;    Prior to Admission medications   Medication Sig Start Date End Date Taking? Authorizing Provider  cyclobenzaprine (FLEXERIL) 10 MG tablet Take 1 tablet (10 mg total) by mouth 3 (three) times daily as needed. 12/22/18   Joni Reining, PA-C  lidocaine (LIDODERM) 5 % Place 1 patch onto the skin every 12 (twelve) hours. Remove & Discard patch within 12 hours or as directed by MD 12/22/18 12/22/19  Joni Reining, PA-C  oxyCODONE-acetaminophen (PERCOCET) 7.5-325 MG tablet Take 1 tablet by mouth every 6 (six) hours as needed. 12/22/18   Joni Reining, PA-C    Allergies Patient has no known allergies.  History reviewed. No pertinent family history.  Social History Social History   Tobacco Use  . Smoking status: Never Smoker  . Smokeless  tobacco: Never Used  Substance Use Topics  . Alcohol use: No  . Drug use: No    Review of Systems Constitutional: No fever/chills Eyes: No visual changes. ENT: No sore throat. Cardiovascular: Denies chest pain. Respiratory: Denies shortness of breath. Gastrointestinal: No abdominal pain.  No nausea, no vomiting.  No diarrhea.  No constipation. Genitourinary: Negative for dysuria. Musculoskeletal: Upper back pain. Skin: Negative for rash. Neurological: Negative for headaches, focal weakness or numbness.   ____________________________________________   PHYSICAL EXAM:  VITAL SIGNS: ED Triage Vitals  Enc Vitals Group     BP 12/22/18 1243 (!) 153/92     Pulse Rate 12/22/18 1243 78     Resp 12/22/18 1243 16     Temp 12/22/18 1243 98.6 F (37 C)     Temp Source 12/22/18 1243 Oral     SpO2 12/22/18 1243 97 %     Weight 12/22/18 1241 220 lb (99.8 kg)     Height 12/22/18 1241 5\' 10"  (1.778 m)     Head Circumference --      Peak Flow --      Pain Score 12/22/18 1241 10     Pain Loc --      Pain Edu? --      Excl. in GC? --     Constitutional: Alert and oriented. Well appearing and in no acute distress. Neck: No stridor.  No cervical spine tenderness to palpation. Hematological/Lymphatic/Immunilogical: No cervical lymphadenopathy. Cardiovascular: Normal rate, regular rhythm. Grossly normal heart sounds.  Good peripheral circulation. Respiratory:  Normal respiratory effort.  No retractions. Lungs CTAB. Musculoskeletal: No obvious thoracic spine deformity.  Patient is moderate guarding palpation of right medial scapular area.  Patient has full equal range of motion of the extremities to [name] pain.  Strength against resistance 5/5.  Neurologic:  Normal speech and language. No gross focal neurologic deficits are appreciated. No gait instability. Skin:  Skin is warm, dry and intact. No rash noted. Psychiatric: Mood and affect are normal. Speech and behavior are  normal.  ____________________________________________   LABS (all labs ordered are listed, but only abnormal results are displayed)  Labs Reviewed - No data to display ____________________________________________  EKG   ____________________________________________  RADIOLOGY  ED MD interpretation:    Official radiology report(s): No results found.  ____________________________________________   PROCEDURES  Procedure(s) performed: None  Procedures  Critical Care performed: No  ____________________________________________   INITIAL IMPRESSION / ASSESSMENT AND PLAN / ED COURSE  As part of my medical decision making, I reviewed the following data within the electronic MEDICAL RECORD NUMBER     Upper back pain secondary to strain.  Patient given discharge care instruction advised take medication as directed.  [facility] advised [facility] persist.      ____________________________________________   FINAL CLINICAL IMPRESSION(S) / ED DIAGNOSES  Final diagnoses:  Muscle strain     ED Discharge Orders         Ordered    lidocaine (LIDODERM) 5 %  Every 12 hours     12/22/18 1417    oxyCODONE-acetaminophen (PERCOCET) 7.5-325 MG tablet  Every 6 hours PRN     12/22/18 1417    cyclobenzaprine (FLEXERIL) 10 MG tablet  3 times daily PRN     12/22/18 1417           Note:  This document was prepared using Dragon voice recognition software and may include unintentional dictation errors.    Joni Reining, PA-C 12/22/18 1428    Emily Filbert, MD 12/22/18 [phone]

## 2018-12-23 ENCOUNTER — Encounter: Payer: Self-pay | Admitting: Emergency Medicine

## 2018-12-23 ENCOUNTER — Emergency Department
Admission: EM | Admit: 2018-12-23 | Discharge: 2018-12-23 | Disposition: A | Discharge status: Home / Self Care | Payer: Self-pay | Attending: Emergency Medicine | Admitting: Emergency Medicine

## 2018-12-23 ENCOUNTER — Other Ambulatory Visit: Payer: Self-pay

## 2018-12-23 ENCOUNTER — Emergency Department: Payer: Self-pay

## 2018-12-23 DIAGNOSIS — R110 Nausea: Secondary | ICD-10-CM | POA: Insufficient documentation

## 2018-12-23 DIAGNOSIS — N200 Calculus of kidney: Secondary | ICD-10-CM | POA: Insufficient documentation

## 2018-12-23 LAB — BASIC METABOLIC PANEL
Anion gap: 7 (ref 5–15)
BUN: 24 mg/dL — ABNORMAL HIGH (ref 6–20)
CALCIUM: 9 mg/dL (ref 8.9–10.3)
CO2: 20 mmol/L — ABNORMAL LOW (ref 22–32)
CREATININE: 1.03 mg/dL (ref 0.61–1.24)
Chloride: 109 mmol/L (ref 98–111)
GFR calc Af Amer: >60 mL/min (ref >60)
Glucose, Bld: 173 mg/dL — ABNORMAL HIGH (ref 70–99)
Potassium: 4.2 mmol/L (ref 3.5–5.1)
Sodium: 136 mmol/L (ref 135–145)

## 2018-12-23 LAB — CBC
HCT: 42.7 % (ref 39.0–52.0)
Hemoglobin: 14.6 g/dL (ref 13.0–17.0)
MCH: 29.3 pg (ref 26.0–34.0)
MCHC: 34.2 g/dL (ref 30.0–36.0)
MCV: 85.6 fL (ref 80.0–100.0)
Platelets: 229 10*3/uL (ref 150–400)
RBC: 4.99 MIL/uL (ref 4.22–5.81)
RDW: 13 % (ref 11.5–15.5)
WBC: 12.8 10*3/uL — ABNORMAL HIGH (ref 4.0–10.5)
nRBC: 0 % (ref 0.0–0.2)

## 2018-12-23 LAB — URINALYSIS, COMPLETE (UACMP) WITH MICROSCOPIC
BACTERIA UA: NONE SEEN
RBC / HPF: >50 RBC/hpf — ABNORMAL HIGH (ref 0–5)
Specific Gravity, Urine: 1.032 — ABNORMAL HIGH (ref 1.005–1.030)

## 2018-12-23 MED ORDER — OXYCODONE-ACETAMINOPHEN 5-325 MG PO TABS: 1.0000 | ORAL_TABLET | ORAL | 0 refills | Status: AC | PRN

## 2018-12-23 MED ORDER — NAPROXEN 500 MG PO TABS: 500.0000 mg | ORAL_TABLET | Freq: Two times a day (BID) | ORAL | 2 refills | Status: DC

## 2018-12-23 MED ORDER — ONDANSETRON HCL 4 MG/2ML IJ SOLN
4.0000 mg | Freq: Once | INTRAMUSCULAR | Status: AC
  Administered 2018-12-23: 4 mg via INTRAVENOUS
  Filled 2018-12-23: qty 2

## 2018-12-23 MED ORDER — KETOROLAC TROMETHAMINE 30 MG/ML IJ SOLN
INTRAMUSCULAR | Status: AC
  Administered 2018-12-23: 30 mg via INTRAVENOUS
  Filled 2018-12-23: qty 1

## 2018-12-23 MED ORDER — KETOROLAC TROMETHAMINE 30 MG/ML IJ SOLN
30.0000 mg | Freq: Once | INTRAMUSCULAR | Status: AC
  Administered 2018-12-23: 30 mg via INTRAVENOUS

## 2018-12-23 MED ORDER — SODIUM CHLORIDE 0.9 % IV BOLUS
1000.0000 mL | Freq: Once | INTRAVENOUS | Status: AC
  Administered 2018-12-23: 1000 mL via INTRAVENOUS

## 2018-12-23 MED ORDER — ONDANSETRON 4 MG PO TBDP: 4.0000 mg | ORAL_TABLET | Freq: Three times a day (TID) | ORAL | 0 refills | Status: DC | PRN

## 2018-12-23 MED ORDER — TAMSULOSIN HCL 0.4 MG PO CAPS: 0.4000 mg | ORAL_CAPSULE | Freq: Every day | ORAL | 0 refills | Status: DC

## 2018-12-23 MED ORDER — FENTANYL CITRATE (PF) 100 MCG/2ML IJ SOLN
50.0000 ug | Freq: Once | INTRAMUSCULAR | Status: AC
  Administered 2018-12-23: 50 ug via INTRAVENOUS
  Filled 2018-12-23: qty 2

## 2018-12-23 NOTE — ED Notes (Signed)
Very dark red/amber urine.

## 2018-12-23 NOTE — ED Notes (Signed)
AAOx3.  Skin warm and dry.  NAD 

## 2018-12-23 NOTE — ED Provider Notes (Signed)
[facility] Emergency Department Provider Note   ____________________________________________    I have reviewed the triage vital signs and the nursing notes.   HISTORY  Chief Complaint Flank Pain and Abdominal Pain   Interpreter used  HPI Jared Foster is a 53 y.o. male who presents with complaints of right-sided flank pain.  Patient reports sharp right-sided flank pain rating into his groin x2 to 3 days.  Positive nausea no vomiting.  No fevers or chills.  No dysuria, unclear whether he has had hematuria.  Has never had pain like this before.  Is not take anything for this.  History reviewed. No pertinent past medical history.  There are no active problems to display for this patient.   Past Surgical History:  Procedure Laterality Date  . APPENDECTOMY    . COLONOSCOPY WITH PROPOFOL N/A 09/01/2017   Procedure: COLONOSCOPY WITH PROPOFOL;  Surgeon: Wyline Mood, MD;  Location: [facility] ENDOSCOPY;  Service: Gastroenterology;  Laterality: N/A;    Prior to Admission medications   Medication Sig Start Date End Date Taking? Authorizing Provider  cyclobenzaprine (FLEXERIL) 10 MG tablet Take 1 tablet (10 mg total) by mouth 3 (three) times daily as needed. 12/22/18   Joni Reining, PA-C  lidocaine (LIDODERM) 5 % Place 1 patch onto the skin every 12 (twelve) hours. Remove & Discard patch within 12 hours or as directed by MD 12/22/18 12/22/19  Joni Reining, PA-C  naproxen (NAPROSYN) 500 MG tablet Take 1 tablet (500 mg total) by mouth 2 (two) times daily with a meal. 12/23/18   Jene Every, MD  ondansetron (ZOFRAN ODT) 4 MG disintegrating tablet Take 1 tablet (4 mg total) by mouth every 8 (eight) hours as needed for nausea or vomiting. 12/23/18   Jene Every, MD  oxyCODONE-acetaminophen (PERCOCET) 5-325 MG tablet Take 1 tablet by mouth every 4 (four) hours as needed for severe pain. 12/23/18 12/23/19  Jene Every, MD  tamsulosin (FLOMAX)  0.4 MG CAPS capsule Take 1 capsule (0.4 mg total) by mouth daily. 12/23/18   Jene Every, MD     Allergies Patient has no known allergies.  No family history on file.  Social History Social History   Tobacco Use  . Smoking status: Never Smoker  . Smokeless tobacco: Never Used  Substance Use Topics  . Alcohol use: No  . Drug use: No    Review of Systems  Constitutional: No fever/chills Eyes: No visual changes.  ENT: No sore throat. Cardiovascular: Denies chest pain. Respiratory: Denies shortness of breath. Gastrointestinal: As above Genitourinary: As above Musculoskeletal: Negative for back pain. Skin: Negative for rash. Neurological: Negative for headaches or weakness   ____________________________________________   PHYSICAL EXAM:  VITAL SIGNS: ED Triage Vitals  Enc Vitals Group     BP 12/23/18 1037 135/77     Pulse Rate 12/23/18 1037 70     Resp 12/23/18 1037 18     Temp 12/23/18 1037 98.1 F (36.7 C)     Temp Source 12/23/18 1037 Oral     SpO2 12/23/18 1037 99 %     Weight 12/23/18 1038 99.8 kg (220 lb)     Height 12/23/18 1038 1.778 m (5\' 10" )     Head Circumference --      Peak Flow --      Pain Score 12/23/18 1038 10     Pain Loc --      Pain Edu? --      Excl. in GC? --  Constitutional: Alert and oriented. Eyes: Conjunctivae are normal.   Nose: No congestion/rhinnorhea. Mouth/Throat: Mucous membranes are moist.    Cardiovascular: Normal rate, regular rhythm. Grossly normal heart sounds.  Good peripheral circulation. Respiratory: Normal respiratory effort.  No retractions. Lungs CTAB. Gastrointestinal: Soft and nontender. No distention.  Mild CVA tenderness Genitourinary: deferred Musculoskeletal: .  Warm and well perfused Neurologic:  Normal speech and language. No gross focal neurologic deficits are appreciated.  Skin:  Skin is warm, dry and intact. No rash noted. Psychiatric: Mood and affect are normal. Speech and behavior are  normal.  ____________________________________________   LABS (all labs ordered are listed, but only abnormal results are displayed)  Labs Reviewed  URINALYSIS, COMPLETE (UACMP) WITH MICROSCOPIC - Abnormal; Notable for the following components:      Result Value   Color, Urine BROWN (*)    APPearance TURBID (*)    Specific Gravity, Urine 1.032 (*)    Glucose, UA   (*)    Value: TEST NOT REPORTED DUE TO COLOR INTERFERENCE OF URINE PIGMENT   Hgb urine dipstick   (*)    Value: TEST NOT REPORTED DUE TO COLOR INTERFERENCE OF URINE PIGMENT   Bilirubin Urine   (*)    Value: TEST NOT REPORTED DUE TO COLOR INTERFERENCE OF URINE PIGMENT   Ketones, ur   (*)    Value: TEST NOT REPORTED DUE TO COLOR INTERFERENCE OF URINE PIGMENT   Protein, ur   (*)    Value: TEST NOT REPORTED DUE TO COLOR INTERFERENCE OF URINE PIGMENT   Nitrite   (*)    Value: TEST NOT REPORTED DUE TO COLOR INTERFERENCE OF URINE PIGMENT   Leukocytes,Ua   (*)    Value: TEST NOT REPORTED DUE TO COLOR INTERFERENCE OF URINE PIGMENT   RBC / HPF >50 (*)    All other components within normal limits  BASIC METABOLIC PANEL - Abnormal; Notable for the following components:   CO2 20 (*)    Glucose, Bld 173 (*)    BUN 24 (*)    All other components within normal limits  CBC - Abnormal; Notable for the following components:   WBC 12.8 (*)    All other components within normal limits   ____________________________________________  EKG  None ____________________________________________  RADIOLOGY  CT renal stone study demonstrates 3 mm calculus in the proximal right ureter ____________________________________________   PROCEDURES  Procedure(s) performed: No  Procedures   Critical Care performed: No ____________________________________________   INITIAL IMPRESSION / ASSESSMENT AND PLAN / ED COURSE  Pertinent labs & imaging results that were available during my care of the patient were reviewed by me and considered  in my medical decision making (see chart for details).  Patient presents with right flank pain, suspicious for ureterolithiasis given IV Toradol with significant improvement in pain.  CT confirms 3 mm right ureterolithiasis.  Patient's pain has been controlled, no evidence of infection, appropriate for outpatient follow-up.    ____________________________________________   FINAL CLINICAL IMPRESSION(S) / ED DIAGNOSES  Final diagnoses:  Kidney stone        Note:  This document was prepared using Dragon voice recognition software and may include unintentional dictation errors.   Jene Every, MD 12/23/18 1535

## 2018-12-23 NOTE — ED Triage Notes (Signed)
LLQ pain and left flank pain began today, history of kidney stones, NAD.

## 2019-06-20 ENCOUNTER — Other Ambulatory Visit: Payer: Self-pay

## 2019-06-20 DIAGNOSIS — Z20822 Contact with and (suspected) exposure to covid-19: Secondary | ICD-10-CM

## 2019-06-22 LAB — NOVEL CORONAVIRUS, NAA: SARS-CoV-2, NAA: DETECTED — AB

## 2020-01-09 ENCOUNTER — Emergency Department
Admission: EM | Admit: 2020-01-09 | Discharge: 2020-01-09 | Disposition: A | Discharge status: Home / Self Care | Payer: Self-pay | Attending: Student | Admitting: Student

## 2020-01-09 ENCOUNTER — Other Ambulatory Visit: Payer: Self-pay

## 2020-01-09 ENCOUNTER — Encounter: Payer: Self-pay | Admitting: Emergency Medicine

## 2020-01-09 ENCOUNTER — Emergency Department: Payer: Self-pay

## 2020-01-09 DIAGNOSIS — R109 Unspecified abdominal pain: Secondary | ICD-10-CM | POA: Insufficient documentation

## 2020-01-09 DIAGNOSIS — B009 Herpesviral infection, unspecified: Secondary | ICD-10-CM | POA: Insufficient documentation

## 2020-01-09 DIAGNOSIS — M545 Low back pain, unspecified: Secondary | ICD-10-CM

## 2020-01-09 DIAGNOSIS — R1030 Lower abdominal pain, unspecified: Secondary | ICD-10-CM | POA: Insufficient documentation

## 2020-01-09 DIAGNOSIS — Z79899 Other long term (current) drug therapy: Secondary | ICD-10-CM | POA: Insufficient documentation

## 2020-01-09 LAB — BASIC METABOLIC PANEL
Anion gap: 7 (ref 5–15)
BUN: 34 mg/dL — ABNORMAL HIGH (ref 6–20)
CO2: 22 mmol/L (ref 22–32)
Calcium: 9.3 mg/dL (ref 8.9–10.3)
Chloride: 108 mmol/L (ref 98–111)
Creatinine, Ser: 0.75 mg/dL (ref 0.61–1.24)
GFR calc Af Amer: >60 mL/min (ref >60)
GFR calc non Af Amer: >60 mL/min (ref >60)
Glucose, Bld: 118 mg/dL — ABNORMAL HIGH (ref 70–99)
Potassium: 4.1 mmol/L (ref 3.5–5.1)
Sodium: 137 mmol/L (ref 135–145)

## 2020-01-09 LAB — URINALYSIS, COMPLETE (UACMP) WITH MICROSCOPIC
Bacteria, UA: NONE SEEN
Bilirubin Urine: NEGATIVE
Glucose, UA: NEGATIVE mg/dL
Hgb urine dipstick: NEGATIVE
Ketones, ur: NEGATIVE mg/dL
Leukocytes,Ua: NEGATIVE
Nitrite: NEGATIVE
Protein, ur: NEGATIVE mg/dL
Specific Gravity, Urine: 1.018 (ref 1.005–1.030)
Squamous Epithelial / HPF: NONE SEEN (ref 0–5)
pH: 5 (ref 5.0–8.0)

## 2020-01-09 LAB — CBC
HCT: 41.6 % (ref 39.0–52.0)
Hemoglobin: 14.4 g/dL (ref 13.0–17.0)
MCH: 29.8 pg (ref 26.0–34.0)
MCHC: 34.6 g/dL (ref 30.0–36.0)
MCV: 86.1 fL (ref 80.0–100.0)
Platelets: 230 10*3/uL (ref 150–400)
RBC: 4.83 MIL/uL (ref 4.22–5.81)
RDW: 13 % (ref 11.5–15.5)
WBC: 7.7 10*3/uL (ref 4.0–10.5)
nRBC: 0 % (ref 0.0–0.2)

## 2020-01-09 LAB — HEPATIC FUNCTION PANEL
ALT: 42 U/L (ref 0–44)
AST: 32 U/L (ref 15–41)
Albumin: 4.4 g/dL (ref 3.5–5.0)
Alkaline Phosphatase: 76 U/L (ref 38–126)
Bilirubin, Direct: <0.1 mg/dL (ref 0.0–0.2)
Total Bilirubin: 0.5 mg/dL (ref 0.3–1.2)
Total Protein: 7.8 g/dL (ref 6.5–8.1)

## 2020-01-09 LAB — LIPASE, BLOOD: Lipase: 52 U/L — ABNORMAL HIGH (ref 11–51)

## 2020-01-09 MED ORDER — KETOROLAC TROMETHAMINE 30 MG/ML IJ SOLN
30.0000 mg | Freq: Once | INTRAMUSCULAR | Status: AC
  Administered 2020-01-09: 30 mg via INTRAMUSCULAR

## 2020-01-09 MED ORDER — LIDOCAINE 5 % EX PTCH
1.0000 | MEDICATED_PATCH | CUTANEOUS | Status: DC
  Administered 2020-01-09: 15:00:00 1 via TRANSDERMAL
  Filled 2020-01-09: qty 1

## 2020-01-09 MED ORDER — IBUPROFEN 600 MG PO TABS: 600.0000 mg | ORAL_TABLET | Freq: Four times a day (QID) | ORAL | 0 refills | Status: AC | PRN

## 2020-01-09 MED ORDER — KETOROLAC TROMETHAMINE 15 MG/ML IJ SOLN
30.0000 mg | Freq: Once | INTRAMUSCULAR | Status: DC
  Filled 2020-01-09: qty 2

## 2020-01-09 MED ORDER — VALACYCLOVIR HCL 1 G PO TABS: 1000.0000 mg | ORAL_TABLET | Freq: Two times a day (BID) | ORAL | 0 refills | Status: AC

## 2020-01-09 MED ORDER — ACETAMINOPHEN 500 MG PO TABS
1000.0000 mg | ORAL_TABLET | Freq: Once | ORAL | Status: AC
  Administered 2020-01-09: 1000 mg via ORAL
  Filled 2020-01-09: qty 2

## 2020-01-09 NOTE — ED Triage Notes (Signed)
Patient reports pain in lower back x5 days. Reports occasional radiation to lowe abdomen. Denies urinary symptoms. Denies N/V/D.

## 2020-01-09 NOTE — ED Provider Notes (Addendum)
[facility] Emergency Department Provider Note  ____________________________________________   First MD Initiated Contact with Patient 01/09/20 1305     (approximate)  I have reviewed the triage vital signs and the nursing notes.  History  Chief Complaint Flank Pain    HPI Jared Foster is a 54 y.o. male with hx of ureterolithiasis who presents to the ED for low back pain. Symptoms present and constant since onset 5 days ago. Located across the lower back, described as aching. Currently moderate in severity.  Radiates somewhat and wraps around to the bilateral lower abdomen. No testicular pain or swelling. No preceding trauma or heavy lifting.  Denies any dysuria or hematuria, however over the last day he has notices a cluster of small blisters that have since ruptured on his penis.  He denies any history of the same, last intercourse was reportedly 1 month ago, states he was wearing a condom. Denies any penile discharge. With regards to his back pain, he denies any weakness, numbness, tingling of his legs.  No difficulty walking.  No difficulty with bowel or bladder control.  No nausea, vomiting, diarrhea.  Does have a history of ureterolithiasis, symptoms are somewhat similar.  History obtained with the assistance of an in person Spanish interpreter.   Past Medical Hx History reviewed. No pertinent past medical history.  Problem List There are no problems to display for this patient.   Past Surgical Hx Past Surgical History:  Procedure Laterality Date  . APPENDECTOMY    . COLONOSCOPY WITH PROPOFOL N/A 09/01/2017   Procedure: COLONOSCOPY WITH PROPOFOL;  Surgeon: Wyline Mood, MD;  Location: [facility] ENDOSCOPY;  Service: Gastroenterology;  Laterality: N/A;    Medications Prior to Admission medications   Medication Sig Start Date End Date Taking? Authorizing Provider  cyclobenzaprine (FLEXERIL) 10 MG tablet Take 1 tablet (10 mg total) by  mouth 3 (three) times daily as needed. 12/22/18   Joni Reining, PA-C  naproxen (NAPROSYN) 500 MG tablet Take 1 tablet (500 mg total) by mouth 2 (two) times daily with a meal. 12/23/18   Jene Every, MD  ondansetron (ZOFRAN ODT) 4 MG disintegrating tablet Take 1 tablet (4 mg total) by mouth every 8 (eight) hours as needed for nausea or vomiting. 12/23/18   Jene Every, MD  tamsulosin (FLOMAX) 0.4 MG CAPS capsule Take 1 capsule (0.4 mg total) by mouth daily. 12/23/18   Jene Every, MD    Allergies Patient has no known allergies.  Family Hx No family history on file.  Social Hx Social History   Tobacco Use  . Smoking status: Never Smoker  . Smokeless tobacco: Never Used  Substance Use Topics  . Alcohol use: No  . Drug use: No     Review of Systems  Constitutional: Negative for fever, chills. Eyes: Negative for visual changes. ENT: Negative for sore throat. Cardiovascular: Negative for chest pain. Respiratory: Negative for shortness of breath. Gastrointestinal: Negative for nausea, vomiting.  Genitourinary: Negative for dysuria. + penile vesicles  Musculoskeletal: Negative for leg swelling. + low back pain Skin: Negative for rash. Neurological: Negative for headaches.   Physical Exam  Vital Signs: ED Triage Vitals  Enc Vitals Group     BP 01/09/20 1231 (!) 163/91     Pulse Rate 01/09/20 1231 80     Resp 01/09/20 1231 16     Temp 01/09/20 1231 99 F (37.2 C)     Temp Source 01/09/20 1231 Oral     SpO2  01/09/20 1231 99 %     Weight 01/09/20 1232 220 lb (99.8 kg)     Height 01/09/20 1232 5\' 10"  (1.778 m)     Head Circumference --      Peak Flow --      Pain Score 01/09/20 1232 10     Pain Loc --      Pain Edu? --      Excl. in GC? --     Constitutional: Alert and oriented. Well appearing.  Head: Normocephalic. Atraumatic. Eyes: Conjunctivae clear, sclera anicteric. Pupils equal and symmetric. Nose: No masses or lesions. No congestion or  rhinorrhea. Mouth/Throat: Wearing mask.  Neck: No stridor. Trachea midline.  Cardiovascular: Normal rate, regular rhythm. Extremities well perfused. Respiratory: Normal respiratory effort.  Lungs CTAB. Gastrointestinal: Soft. Non-distended. Non-tender.  Genitourinary: RN chaperone present. Small cluster of ruptured vesicles on the shaft of the penis. No penile discharge. No other rashes or lesions. Testes of normal lie. No testicular swelling or erythema.  Musculoskeletal: No lower extremity edema. No deformities. Back: Bilateral paraspinal lumbar muscular tenderness. Able to flex and extend back, though with some reproduction of pain. No pain with straight leg raise.  Neurologic:  Normal speech and language. No gross focal or lateralizing neurologic deficits are appreciated. BLE strength 5/5 and symmetric. SILT.   Skin: Skin is warm, dry and intact. No rash noted. Psychiatric: Mood and affect are appropriate for situation.   Radiology  CT renal: IMPRESSION:  1. 2 x 2 mm calculus in the lower region of the right kidney. No  hydronephrosis or ureteral calculus on either side. Urinary bladder  wall thickness normal.   2. Prominent prostate which warrants correlation with physical  examination and PSA. Occasional small prostatic calculi noted.   3. Spinal stenosis at L3-4 and L4-5 due to bony hypertrophy and disc  protrusion.   4. Thickening of the gastric wall in the body region. Question a  degree of underlying gastritis. No other bowel wall thickening  noted. Occasional sigmoid diverticula noted without diverticulitis.  No bowel obstruction. No abscess in the abdomen or pelvis. Appendix  absent.    Procedures  Procedure(s) performed (including critical care):  Procedures   Initial Impression / Assessment and Plan / MDM / ED Course  54 y.o. male who presents to the ED for low back pain, as above. Aside also complains of now ruptured blisters on the shaft of his penis.    Ddx: MSK pain, kidney stone, muscle spasm. Aside, GU physical exam consistent with herpes, will plan to treat with course of antivirals.   Will obtain labs, urine, imaging for evaluation of low back pain.   Labs without actionable derangements.  No evidence of infection on UA.  CT shows a calculus in the lower region of the right kidney, but no ureteral calculus on either side.  Some spinal stenosis in the lumbar region which could be contributing to his back pain. Prominent prostate. Updated patient on results with Spanish interpreter, including the need for follow up with PCP regarding prostate findings. Patient reports improvement in pain. Will plan for discharge with Rx of antivirals as noted above, Rx for ibuprofen and PCP follow-up.  Patient voices understanding and is comfortable with the plan and discharge.  All questions answered with the assistance of Spanish interpreter.    _______________________________  As part of my medical decision making I have reviewed available labs, radiology tests, reviewed old records.  Final Clinical Impression(s) / ED Diagnosis  Final diagnoses:  Acute bilateral low back pain without sciatica  Herpes       Note:  This document was prepared using Dragon voice recognition software and may include unintentional dictation errors.     Lilia Pro., MD 01/09/20 1539

## 2020-01-09 NOTE — Discharge Instructions (Addendum)
Thank you for letting us take care of you in the emergency department today.   Please continue to take any regular, prescribed medications.   New medications we have prescribed:  Valtrex - antiviral medication for your rash Ibuprofen - take as needed as directed for your low back pain  Please follow up with: A primary care doctor to review your ER visit and follow up on your symptoms. Information for two different clinics is listed below.   Please return to the ER for any new or worsening symptoms.

## 2020-01-20 ENCOUNTER — Ambulatory Visit: Payer: Self-pay | Attending: Internal Medicine

## 2020-02-23 ENCOUNTER — Ambulatory Visit: Payer: Self-pay | Attending: Internal Medicine

## 2020-02-23 DIAGNOSIS — Z23 Encounter for immunization: Secondary | ICD-10-CM

## 2020-02-23 NOTE — Progress Notes (Signed)
   Covid-19 Vaccination Clinic  Name:  Donte Lenzo    MRN: [id number] DOB: 04/13/1966  02/23/2020  Mr. Nadeem Romanoski was observed post Covid-19 immunization for 15 minutes without incident. He was provided with Vaccine Information Sheet and instruction to access the V-Safe system.   Mr. Ebbie Cherry was instructed to call 911 with any severe reactions post vaccine: Marland Kitchen Difficulty breathing  . Swelling of face and throat  . A fast heartbeat  . A bad rash all over body  . Dizziness and weakness   Immunizations Administered    Name Date Dose VIS Date Route   Pfizer COVID-19 Vaccine 02/23/2020 11:36 AM 0.3 mL 12/28/2018 Intramuscular   Manufacturer: [employer], [employer]   Lot: [id number]   NDC: [address]-6

## 2020-03-20 ENCOUNTER — Ambulatory Visit: Payer: Self-pay | Attending: Internal Medicine

## 2020-03-20 DIAGNOSIS — Z23 Encounter for immunization: Secondary | ICD-10-CM

## 2020-03-20 NOTE — Progress Notes (Signed)
   Covid-19 Vaccination Clinic  Name:  Danyon Mcginness    MRN: [id number] DOB: [birth year]  03/20/2020  Mr. Markise Haymer was observed post Covid-19 immunization for 15 minutes without incident. He was provided with Vaccine Information Sheet and instruction to access the V-Safe system.   Mr. Arlind Klingerman was instructed to call 911 with any severe reactions post vaccine: Marland Kitchen Difficulty breathing  . Swelling of face and throat  . A fast heartbeat  . A bad rash all over body  . Dizziness and weakness   Immunizations Administered    Name Date Dose VIS Date Route   Pfizer COVID-19 Vaccine 03/20/2020  8:18 AM 0.3 mL 12/28/2018 Intramuscular   Manufacturer: [employer], [employer]   Lot: [id number]   NDC: [address]-8

## 2021-05-18 ENCOUNTER — Emergency Department
Admission: EM | Admit: 2021-05-18 | Discharge: 2021-05-18 | Disposition: A | Discharge status: Home / Self Care | Payer: 59 | Attending: Emergency Medicine | Admitting: Emergency Medicine

## 2021-05-18 ENCOUNTER — Emergency Department: Payer: 59

## 2021-05-18 ENCOUNTER — Other Ambulatory Visit: Payer: Self-pay

## 2021-05-18 ENCOUNTER — Encounter: Payer: Self-pay | Admitting: Emergency Medicine

## 2021-05-18 DIAGNOSIS — R300 Dysuria: Secondary | ICD-10-CM | POA: Diagnosis not present

## 2021-05-18 DIAGNOSIS — K6289 Other specified diseases of anus and rectum: Secondary | ICD-10-CM | POA: Diagnosis not present

## 2021-05-18 LAB — COMPREHENSIVE METABOLIC PANEL
ALT: 50 U/L — ABNORMAL HIGH (ref 0–44)
AST: 37 U/L (ref 15–41)
Albumin: 4.4 g/dL (ref 3.5–5.0)
Alkaline Phosphatase: 84 U/L (ref 38–126)
Anion gap: 7 (ref 5–15)
BUN: 23 mg/dL — ABNORMAL HIGH (ref 6–20)
CO2: 26 mmol/L (ref 22–32)
Calcium: 9.7 mg/dL (ref 8.9–10.3)
Chloride: 105 mmol/L (ref 98–111)
Creatinine, Ser: 0.98 mg/dL (ref 0.61–1.24)
GFR, Estimated: >60 mL/min (ref >60)
Glucose, Bld: 95 mg/dL (ref 70–99)
Potassium: 4.2 mmol/L (ref 3.5–5.1)
Sodium: 138 mmol/L (ref 135–145)
Total Bilirubin: 0.7 mg/dL (ref 0.3–1.2)
Total Protein: 8 g/dL (ref 6.5–8.1)

## 2021-05-18 LAB — URINALYSIS, COMPLETE (UACMP) WITH MICROSCOPIC
Bacteria, UA: NONE SEEN
Bilirubin Urine: NEGATIVE
Glucose, UA: NEGATIVE mg/dL
Hgb urine dipstick: NEGATIVE
Ketones, ur: NEGATIVE mg/dL
Leukocytes,Ua: NEGATIVE
Nitrite: NEGATIVE
Protein, ur: NEGATIVE mg/dL
Specific Gravity, Urine: 1.023 (ref 1.005–1.030)
Squamous Epithelial / HPF: NONE SEEN (ref 0–5)
pH: 5 (ref 5.0–8.0)

## 2021-05-18 LAB — CBC
HCT: 41.2 % (ref 39.0–52.0)
Hemoglobin: 14.2 g/dL (ref 13.0–17.0)
MCH: 30 pg (ref 26.0–34.0)
MCHC: 34.5 g/dL (ref 30.0–36.0)
MCV: 87.1 fL (ref 80.0–100.0)
Platelets: 247 10*3/uL (ref 150–400)
RBC: 4.73 MIL/uL (ref 4.22–5.81)
RDW: 12.9 % (ref 11.5–15.5)
WBC: 9.5 10*3/uL (ref 4.0–10.5)
nRBC: 0 % (ref 0.0–0.2)

## 2021-05-18 LAB — LIPASE, BLOOD: Lipase: 53 U/L — ABNORMAL HIGH (ref 11–51)

## 2021-05-18 MED ORDER — CIPROFLOXACIN HCL 500 MG PO TABS: 500.0000 mg | ORAL_TABLET | Freq: Two times a day (BID) | ORAL | 0 refills | Status: AC

## 2021-05-18 MED ORDER — IOHEXOL 300 MG/ML  SOLN
100.0000 mL | Freq: Once | INTRAMUSCULAR | Status: DC | PRN
  Filled 2021-05-18: qty 100

## 2021-05-18 MED ORDER — DIBUCAINE (PERIANAL) 1 % EX OINT: 1.0000 "application " | TOPICAL_OINTMENT | CUTANEOUS | 0 refills | Status: DC | PRN

## 2021-05-18 MED ORDER — IOHEXOL 350 MG/ML SOLN
100.0000 mL | Freq: Once | INTRAVENOUS | Status: AC | PRN
  Administered 2021-05-18: 100 mL via INTRAVENOUS
  Filled 2021-05-18: qty 100

## 2021-05-18 NOTE — ED Triage Notes (Addendum)
Pt via POV from home. Pt c/o abscess on his rectum that showed up 3 days ago. Pt also c/o bilateral flank pain that pt state travels to the front of his abd. Pt also c/o burning when he pees. Denies NVD. Pt is A&Ox4 and NAD.

## 2021-05-18 NOTE — ED Notes (Signed)
Attempted bloodwork twice, unsuccessful attempt.

## 2021-05-18 NOTE — Discharge Instructions (Addendum)
Please follow-up with Dr. Irish Elders for enlarged prostate. Please follow-up with Dr. Aleen Campi for possible hemorrhoid.

## 2021-05-18 NOTE — ED Provider Notes (Signed)
[facility]-EMERGENCY DEPARTMENT  ____________________________________________  Time seen: Approximately 11:28 PM  I have reviewed the triage vital signs and the nursing notes.   HISTORY  Chief Complaint Abscess and Flank Pain   Historian Patient    HPI Jared Foster is a 55 y.o. male presents to the emergency department with concern for a bulge around his rectum and also dysuria and increased urinary frequency.  Patient reports that he also feels some discomfort in her scrotum.  He reports that he has had symptoms for the past 2 to 3 days.  No nausea, vomiting or abdominal pain.  No diarrhea.  No fever or chills at home.  Patient denies a history of perirectal or perianal abscesses before.  Patient does report that he has had a history of prior hemorrhoids which have not needed a surgical procedure such as band ligation.   History reviewed. No pertinent past medical history.   Immunizations up to date:  Yes.     History reviewed. No pertinent past medical history.  There are no problems to display for this patient.   Past Surgical History:  Procedure Laterality Date   APPENDECTOMY     COLONOSCOPY WITH PROPOFOL N/A 09/01/2017   Procedure: COLONOSCOPY WITH PROPOFOL;  Surgeon: Wyline Mood, MD;  Location: [facility] ENDOSCOPY;  Service: Gastroenterology;  Laterality: N/A;    Prior to Admission medications   Medication Sig Start Date End Date Taking? Authorizing Provider  ciprofloxacin (CIPRO) 500 MG tablet Take 1 tablet (500 mg total) by mouth 2 (two) times daily for 28 days. 05/18/21 06/15/21 Yes Pia Mau M, PA-C  dibucaine (NUPERCAINAL) 1 % OINT Place 1 application rectally as needed for hemorrhoids. 05/18/21  Yes Pia Mau M, PA-C  cyclobenzaprine (FLEXERIL) 10 MG tablet Take 1 tablet (10 mg total) by mouth 3 (three) times daily as needed. 12/22/18   Joni Reining, PA-C  naproxen (NAPROSYN) 500 MG tablet Take 1 tablet (500 mg total) by mouth 2 (two) times  daily with a meal. 12/23/18   Jene Every, MD  ondansetron (ZOFRAN ODT) 4 MG disintegrating tablet Take 1 tablet (4 mg total) by mouth every 8 (eight) hours as needed for nausea or vomiting. 12/23/18   Jene Every, MD  tamsulosin (FLOMAX) 0.4 MG CAPS capsule Take 1 capsule (0.4 mg total) by mouth daily. 12/23/18   Jene Every, MD    Allergies Patient has no known allergies.  History reviewed. No pertinent family history.  Social History Social History   Tobacco Use   Smoking status: Never   Smokeless tobacco: Never  Vaping Use   Vaping Use: Never used  Substance Use Topics   Alcohol use: No   Drug use: No     Review of Systems  Constitutional: No fever/chills Eyes:  No discharge ENT: No upper respiratory complaints. Respiratory: no cough. No SOB/ use of accessory muscles to breath Gastrointestinal:  Patient has perirectal pain.  Musculoskeletal: Negative for musculoskeletal pain. Skin: Negative for rash, abrasions, lacerations, ecchymosis.    ____________________________________________   PHYSICAL EXAM:  VITAL SIGNS: ED Triage Vitals  Enc Vitals Group     BP 05/18/21 1625 (!) 172/99     Pulse Rate 05/18/21 1625 (!) 102     Resp 05/18/21 1625 20     Temp 05/18/21 1625 98.6 F (37 C)     Temp Source 05/18/21 1625 Oral     SpO2 05/18/21 1625 100 %     Weight 05/18/21 1626 220 lb (99.8 kg)  Height 05/18/21 1626 5\' 10"  (1.778 m)     Head Circumference --      Peak Flow --      Pain Score 05/18/21 1626 9     Pain Loc --      Pain Edu? --      Excl. in GC? --      Constitutional: Alert and oriented. Well appearing and in no acute distress. Eyes: Conjunctivae are normal. PERRL. EOMI. Head: Atraumatic. ENT: Cardiovascular: Normal rate, regular rhythm. Normal S1 and S2.  Good peripheral circulation. Respiratory: Normal respiratory effort without tachypnea or retractions. Lungs CTAB. Good air entry to the bases with no decreased or absent breath  sounds Gastrointestinal: Bowel sounds x 4 quadrants. Soft and nontender to palpation. No guarding or rigidity. No distention.  Patient has a 1 and half centimeter by 1 and half centimeter soft, nonindurated protuberance at anus in the 8 o'clock position. Musculoskeletal: Full range of motion to all extremities. No obvious deformities noted Neurologic:  Normal for age. No gross focal neurologic deficits are appreciated.  Skin:  Skin is warm, dry and intact. No rash noted. Psychiatric: Mood and affect are normal for age. Speech and behavior are normal.   ____________________________________________   LABS (all labs ordered are listed, but only abnormal results are displayed)  Labs Reviewed  LIPASE, BLOOD - Abnormal; Notable for the following components:      Result Value   Lipase 53 (*)    All other components within normal limits  COMPREHENSIVE METABOLIC PANEL - Abnormal; Notable for the following components:   BUN 23 (*)    ALT 50 (*)    All other components within normal limits  URINALYSIS, COMPLETE (UACMP) WITH MICROSCOPIC - Abnormal; Notable for the following components:   Color, Urine YELLOW (*)    APPearance CLEAR (*)    All other components within normal limits  URINE CULTURE  CBC   ____________________________________________  EKG   ____________________________________________  RADIOLOGY 05/20/21, personally viewed and evaluated these images (plain radiographs) as part of my medical decision making, as well as reviewing the written report by the radiologist.  CT ABDOMEN PELVIS W CONTRAST  Result Date: 05/18/2021 CLINICAL DATA:  Abdominal abscess suspected. EXAM: CT ABDOMEN AND PELVIS WITH CONTRAST TECHNIQUE: Multidetector CT imaging of the abdomen and pelvis was performed using the standard protocol following bolus administration of intravenous contrast. CONTRAST:  05/20/2021 OMNIPAQUE IOHEXOL 350 MG/ML SOLN COMPARISON:  January 09, 2020 FINDINGS: Lower chest: No acute  abnormality. Hepatobiliary: No focal liver abnormality is seen. No gallstones, gallbladder wall thickening, or biliary dilatation. Pancreas: Unremarkable. No pancreatic ductal dilatation or surrounding inflammatory changes. Spleen: Normal in size without focal abnormality. Adrenals/Urinary Tract: Adrenal glands are unremarkable. Kidneys are normal, without renal calculi, or hydronephrosis. Few bilateral too small to be actually characterized hypoattenuated lesions, which may represent cysts. Bladder is unremarkable. Stomach/Bowel: Stomach is within normal limits. Post appendectomy. No evidence of bowel wall thickening, distention, or inflammatory changes. Vascular/Lymphatic: No significant vascular findings are present. No enlarged abdominal or pelvic lymph nodes. Reproductive: Prominent size of the prostate gland, which measures 5.9 cm transversely. Other: No abdominal wall hernia or abnormality. No abdominopelvic ascites. Musculoskeletal: No acute or significant osseous findings. IMPRESSION: 1. No evidence of acute abnormalities within the abdomen or pelvis. 2. Prominent size of the prostate gland. Please correlate to serum PSA values. Electronically Signed   By: January 11, 2020 M.D.   On: 05/18/2021 18:40    ____________________________________________  PROCEDURES  Procedure(s) performed:     Procedures     Medications  iohexol (OMNIPAQUE) 350 MG/ML injection 100 mL (100 mLs Intravenous Contrast Given 05/18/21 1813)     ____________________________________________   INITIAL IMPRESSION / ASSESSMENT AND PLAN / ED COURSE  Pertinent labs & imaging results that were available during my care of the patient were reviewed by me and considered in my medical decision making (see chart for details).      Assessment and plan Rectal pain Dysuria  Increased urinary frequency 55 year old male presents to the emergency department with rectal discomfort, dysuria, scrotal discomfort and  increased urinary frequency.  Patient was hypertensive at triage and mildly tachycardic.  On exam, patient had a 1 and half centimeter protuberance at anus at the 8 o'clock position.  Region was soft and nonindurated with no overlying erythema.  There was no expression of purulence.  There were no anal fissures.  Differential diagnosis included abscess versus hemorrhoid.  CT abdomen pelvis was obtained which showed no complex fluid collection concerning for perianal/perirectal abscess.  It did show prostate enlargement.  With patient's dysuria and increased urinary frequency, and concern for prostatitis.  Urine culture is pending.  We will start patient on ciprofloxacin twice daily for the next 28 days.  Patient was prescribed Dibucaine ointment for likely hemorrhoid.  We will have patient follow-up with general surgery as needed if symptoms do not improve.     ____________________________________________  FINAL CLINICAL IMPRESSION(S) / ED DIAGNOSES  Final diagnoses:  Rectal pain      NEW MEDICATIONS STARTED DURING THIS VISIT:  ED Discharge Orders          Ordered    dibucaine (NUPERCAINAL) 1 % OINT  As needed        05/18/21 1902    ciprofloxacin (CIPRO) 500 MG tablet  2 times daily        05/18/21 1902                This chart was dictated using voice recognition software/Dragon. Despite best efforts to proofread, errors can occur which can change the meaning. Any change was purely unintentional.     Orvil Feil, PA-C 05/18/21 2333    Minna Antis, MD 05/26/21 1359

## 2021-05-20 LAB — URINE CULTURE: Culture: NO GROWTH

## 2021-06-03 ENCOUNTER — Ambulatory Visit: Payer: 59 | Admitting: Surgery

## 2021-06-03 ENCOUNTER — Other Ambulatory Visit: Payer: Self-pay

## 2021-06-03 ENCOUNTER — Encounter: Payer: Self-pay | Admitting: Surgery

## 2021-06-03 VITALS — BP 152/87 | HR 86 | Temp 98.3°F | Ht 70.0 in | Wt 230.0 lb

## 2021-06-03 DIAGNOSIS — N419 Inflammatory disease of prostate, unspecified: Secondary | ICD-10-CM

## 2021-06-03 DIAGNOSIS — K644 Residual hemorrhoidal skin tags: Secondary | ICD-10-CM

## 2021-06-03 NOTE — Progress Notes (Signed)
06/03/2021  Reason for Visit:  External hemorrhoids  History of Present Illness: Jared Foster is a 55 y.o. male presenting for evaluation of external hemorrhoids.  The patient presented to the ED on 05/18/21 with perianal pain as well as dysuria and urinary frequency.  He was more worried about the perianal pain, which had been ongoing for a couple days.  In the ED, he was found to have an inflamed external hemorrhoid.  He had a CT scan of the pelvis as a precaution to rule out any possible perianal abscess, which was negative, but it did find an enlarged prostate.  Given his dysuria, was diagnosed with prostatitis and started on a 4 week course of Ciprofloxacin as well as Flomax.  He was referred to General Surgery for evaluation of his hemorrhoid and to Urology for his prostatitis, although no referral order was placed.    Today, the patient reports that the perianal pain has resolved although he still feels a mass in the perianal area that he can move.  He reports much improved urinary symptoms as well.  He denies any issues with constipation or straining for bowel movement.  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  Past Medical History: No past medical history on file.   Past Surgical History: Past Surgical History:  Procedure Laterality Date   APPENDECTOMY     COLONOSCOPY WITH PROPOFOL N/A 09/01/2017   Procedure: COLONOSCOPY WITH PROPOFOL;  Surgeon: Jonathon Bellows, MD;  Location: [facility] ENDOSCOPY;  Service: Gastroenterology;  Laterality: N/A;    Home Medications: Prior to Admission medications   Medication Sig Start Date End Date Taking? Authorizing Provider  ciprofloxacin (CIPRO) 500 MG tablet Take 1 tablet (500 mg total) by mouth 2 (two) times daily for 28 days. 05/18/21 06/15/21 Yes Vallarie Mare M, PA-C  dibucaine (NUPERCAINAL) 1 % OINT Place 1 application rectally as needed for hemorrhoids. 05/18/21   Yes Vallarie Mare M, PA-C  tamsulosin (FLOMAX) 0.4 MG CAPS capsule Take 1 capsule (0.4 mg total) by mouth daily. 12/23/18  Yes Lavonia Drafts, MD  cyclobenzaprine (FLEXERIL) 10 MG tablet Take 1 tablet (10 mg total) by mouth 3 (three) times daily as needed. 12/22/18   Sable Feil, PA-C  naproxen (NAPROSYN) 500 MG tablet Take 1 tablet (500 mg total) by mouth 2 (two) times daily with a meal. 12/23/18   Lavonia Drafts, MD  ondansetron (ZOFRAN ODT) 4 MG disintegrating tablet Take 1 tablet (4 mg total) by mouth every 8 (eight) hours as needed for nausea or vomiting. 12/23/18   Lavonia Drafts, MD    Allergies: No Known Allergies  Social History:  reports that he has never smoked. He has never used smokeless tobacco. He reports that he does not drink alcohol and does not use drugs.   Family History: No family history on file.  Review of Systems: Review of Systems  Constitutional:  Negative for chills and fever.  HENT:  Negative for hearing loss.   Respiratory:  Negative for shortness of breath.   Cardiovascular:  Negative for chest pain.  Gastrointestinal:  Negative for abdominal pain, blood in stool, constipation, nausea and vomiting.  Genitourinary:  Negative for dysuria.  Musculoskeletal:  Negative for myalgias.  Skin:  Negative for rash.  Neurological:  Negative for dizziness.  Psychiatric/Behavioral:  Negative for depression.    Physical Exam BP (!) 152/87   Pulse 86   Temp 98.3  F (36.8 C)   Ht '5\' 10"'  (1.778 m)   Wt 230 lb (104.3 kg)   SpO2 96%   BMI 33.00 kg/m  CONSTITUTIONAL: No acute distress, well nourished. HEENT:  Normocephalic, atraumatic, extraocular motion intact. NECK: Trachea is midline, and there is no jugular venous distension.  RESPIRATORY:  Normal respiratory effort without pathologic use of accessory muscles. CARDIOVASCULAR: Regular rhythm and rate. GI: The abdomen is soft, non-distended, non-tender.  RECTAL:  External exam reveals an enlarged but not  inflamed left lateral external hemorrhoid.  There is a small perianal skin tag in right posterior column consistent with prior hemorrhoid issues.  No evidence of thrombosis, and there's no tenderness to palpation.  Digital rectal exam reveals mildly enlarged left lateral internal component but otherwise normal hemorrhoid exam.  Prostate is palpable and appears uniform but enlarged. MUSCULOSKELETAL:  Normal muscle strength and tone in all four extremities.  No peripheral edema or cyanosis. SKIN: Skin turgor is normal. There are no pathologic skin lesions.  NEUROLOGIC:  Motor and sensation is grossly normal.  Cranial nerves are grossly intact. PSYCH:  Alert and oriented to person, place and time. Affect is normal.  Laboratory Analysis: Labs from 05/18/21: Na 138, K 4.2, Cl 105, CO2 26, BUN 23, Cr 0.98, lipase 53.  Total bili 0.7, AST 37, ALT 50, Alk Phos 84.  WBC 9.5, Hgb 14.2, Hct 41.2, Plt 247.  Imaging: CT scan abdomen/pelvis 05/18/21: IMPRESSION: 1. No evidence of acute abnormalities within the abdomen or pelvis. 2. Prominent size of the prostate gland. Please correlate to serum PSA values.  Assessment and Plan: This is a 55 y.o. male with recent episode of inflamed external hemorrhoid and possible prostatitis.  --With regards to the external hemorrhoid, the flare-up appears to be resolving on its own.  There is no evidence that the left lateral component was thrombosed, and the tissue is soft and non-tender, though still enlarged.  The patient does not have any constipation issues and is having normal soft daily bowel movements.  Discussed about risk factors for hemorrhoids.  At this point, he reports this is his first flare-up.  He is not interested in surgery at this point and I also do not see a particular push for it.  However, discussed that if he were to get more flareups or having further issues, we could consider surgery in the future. --With regards to his prostatitis, the ED note  mentions referral to Dr. Diamantina Providence, but I do not see any active referrals.  Will place new order for referral for evaluation. --Follow up with me as needed.  Face-to-face time spent with the patient and care providers was 45 minutes, with more than 50% of the time spent counseling, educating, and coordinating care of the patient.     Melvyn Neth, [facility]

## 2021-06-03 NOTE — Patient Instructions (Addendum)
We will send a referral to Urology for you to be seen. They will call you to schedule this appointment.  hemos enviado una derivacin a Urologa. ellos lo llamarn para programar una cita.   haga un seguimiento aqu si es necesario. comer una dieta alta en fibra. trate de evitar el estreimiento. Follow-up with our office as needed.  Please call and ask to speak with a nurse if you develop questions or concerns.  Hemorroides Hemorrhoids Las hemorroides son venas inflamadas adentro o alrededor del recto o del ano. Hay dos tipos de hemorroides: Hemorroides internas. Se forman en las venas del interior del recto. Pueden abultarse hacia afuera, irritarse y doler. Hemorroides externas. Se producen en las venas externas del ano y pueden sentirse como un bulto o zona hinchada, dura y dolorosa cerca del ano. La mayora de las hemorroides no causan problemas graves y se Sports coach con tratamientos caseros [employer] cambios en la dieta y el estilo de vida. Si los tratamientos caseros no ayudan con los sntomas, se pueden realizarprocedimientos para reducir o extirpar las hemorroides. Cules son las causas? La causa de esta afeccin es el aumento de la presin en la zona anal. Esta presin puede ser causada por distintos factores, por ejemplo: Estreimiento. Hacer un gran esfuerzo para defecar. Diarrea. Embarazo. Obesidad. Estar sentado durante largos perodos de [place]. Levantar objetos pesados u otras actividades que impliquen esfuerzo. Sexo anal. Andar en bicicleta por un largo perodo de tiempo. Cules son los signos o los sntomas? Los sntomas de esta afeccin incluyen los siguientes: Engineer, mining. Picazn o irritacin anal. Sangrado rectal. Prdida de materia fecal (heces). Inflamacin anal. Uno o ms bultos alrededor del ano. Cmo se diagnostica? Esta afeccin se diagnostica frecuentemente a travs de un examen visual. Posiblemente le realicen otros tipos de pruebas o estudios, como  los siguientes: Un examen que implica palpar el rea rectal con la mano enguantada (examen rectal digital). Un examen del canal anal que se realiza utilizando un pequeo tubo (anoscopio). Anlisis de sangre si ha perdido [place] cantidad significativa de [place]. Una prueba que consiste en la observacin del interior del colon utilizando un tubo flexible con una cmara en el extremo (sigmoidoscopia o colonoscopa). Cmo se trata? Esta afeccin generalmente se puede tratar en el hogar. Sin embargo, se pueden Wellsite geologist procedimientos si los cambios en la dieta, en el estilo de vida y otros tratamientos caseros no [employer] sntomas. Estos procedimientos pueden ayudar a reducir o extirpar las hemorroides completamente. Algunos de estos procedimientos son quirrgicos y otros no. Algunos de los procedimientos ms frecuentes son los siguientes: Ligadura con Curator. Las bandas elsticas se colocan en la base de las hemorroides para interrumpir su irrigacin de Earth. Escleroterapia. Se inyecta un medicamento en las hemorroides para reducir su tamao. Coagulacin con luz infrarroja. Se utiliza un tipo de energa lumnica para eliminar las hemorroides. Hemorroidectoma. Las hemorroides se extirpan con [place] y las venas que las [place] se Web designer. Hemorroidopexia con grapas. El cirujano engrapa la base de las hemorroides a la pared del recto. Siga estas indicaciones en su casa: Comida y bebida  Consuma alimentos con alto contenido de [place], como cereales integrales, porotos, frutos secos, frutas y verduras. Pregntele a su mdico acerca de tomar productos con fibra aadida en ellos (complementos de fibra). Disminuya la cantidad de grasa de la dieta. Esto se puede lograr consumiendo productos lcteos con bajo contenido de grasas, ingiriendo menor cantidad de carnes rojas y evitando los alimentos procesados. Beba suficiente lquido [employer]  para mantener la orina de color amarillo plido.  Control  del dolor y la hinchazn  Tome baos de asiento tibios durante 20 minutos, 3 o 4 veces por da para Primary school teacher y las [place]. Puede hacer esto en una baera o usar un dispositivo porttil para bao de asiento que se coloca sobre el inodoro. Si se lo indican, aplique hielo en la zona afectada. Usar compresas de [employer] baos de asiento puede ser [place]. Ponga el hielo en una bolsa plstica. Coloque una toalla entre la piel y Copy. Coloque el hielo durante 20 minutos, 2 a 3 veces por da.  Indicaciones generales Baxter International de venta libre y los recetados solamente como se lo haya indicado el mdico. Aplquese los medicamentos, cremas o supositorios como se lo hayan indicado. Haga ejercicio con regularidad. Consulte al mdico qu cantidad y qu tipo de ejercicio es mejor para usted. En general, debe realizar al menos 30 minutos de ejercicio moderado la DIRECTV de la semana (150 minutos cada semana). Esto puede incluir [employer], andar en bicicleta o practicar yoga. Vaya al bao cuando sienta la necesidad de defecar. No espere. Evite hacer fuerza en las deposiciones. Mantenga la zona anal limpia y seca. Use papel higinico hmedo o toallitas humedecidas despus de las deposiciones. No pase mucho tiempo sentado en el inodoro. Esto aumenta la afluencia de sangre y Chief Technology Officer. Concurra a todas las visitas de [address] se lo haya indicado el mdico. Esto es importante. Comunquese con un mdico si tiene: Aumento del dolor y la hinchazn que no puede controlar con medicamentos o TEFL teacher. No puede defecar o lo hace con dificultad. Dolor o tiene inflamacin fuera de la zona de las hemorroides. Solicite ayuda inmediatamente si tiene: Hemorragia descontrolada en el recto. Resumen Las hemorroides son venas inflamadas adentro o alrededor del recto o del ano. La mayora de las hemorroides se pueden controlar con tratamientos caseros como  cambios en la dieta y el estilo de vida. Tomar baos de asiento con agua tibia puede ayudar a Engineer, materials y las [place]. En los casos graves, se pueden realizar procedimientos o [place]omia Dear ciruga para reducir o extirpar las hemorroides. Esta informacin no tiene Theme park manager el consejo del mdico. Asegresede hacerle al mdico cualquier pregunta que tenga. Document Revised: 04/29/2018 Document Reviewed: 04/29/2018 Elsevier Patient Education  2022 [employer].

## 2021-06-13 ENCOUNTER — Encounter: Payer: Self-pay | Admitting: Urology

## 2021-06-13 ENCOUNTER — Other Ambulatory Visit: Payer: Self-pay

## 2021-06-13 ENCOUNTER — Ambulatory Visit (INDEPENDENT_AMBULATORY_CARE_PROVIDER_SITE_OTHER): Payer: 59 | Admitting: Urology

## 2021-06-13 VITALS — BP 153/84 | HR 99 | Ht 70.0 in | Wt 230.0 lb

## 2021-06-13 DIAGNOSIS — N410 Acute prostatitis: Secondary | ICD-10-CM | POA: Diagnosis not present

## 2021-06-13 DIAGNOSIS — M7989 Other specified soft tissue disorders: Secondary | ICD-10-CM | POA: Diagnosis not present

## 2021-06-13 DIAGNOSIS — Z125 Encounter for screening for malignant neoplasm of prostate: Secondary | ICD-10-CM | POA: Diagnosis not present

## 2021-06-13 LAB — URINALYSIS, COMPLETE
Bilirubin, UA: NEGATIVE
Glucose, UA: NEGATIVE
Ketones, UA: NEGATIVE
Leukocytes,UA: NEGATIVE
Nitrite, UA: NEGATIVE
Protein,UA: NEGATIVE
Specific Gravity, UA: 1.025 (ref 1.005–1.030)
Urobilinogen, Ur: 0.2 mg/dL (ref 0.2–1.0)
pH, UA: 5 (ref 5.0–7.5)

## 2021-06-13 LAB — MICROSCOPIC EXAMINATION
Bacteria, UA: NONE SEEN
Epithelial Cells (non renal): NONE SEEN /hpf (ref 0–10)

## 2021-06-13 LAB — BLADDER SCAN AMB NON-IMAGING

## 2021-06-13 NOTE — Patient Instructions (Signed)
Prueba de deteccin del cncer de prstata Prostate Cancer Screening  La prueba de deteccin del cncer de prstata es una prueba que se realiza para detectar la presencia de cncer de prstata en los hombres. La prstata es una glndula del tamao de una nuez que se encuentra debajo de la vejiga y delante del recto en los hombres. La funcin de la prstata es agregar lquido en el semen Teacher, English as a foreign language. El cncer de prstata es el segundo tipo decncer ms [employer] Corporation. Quin debe realizarse estudios para la deteccin de cncer de prstata?  Las recomendaciones para la prueba de deteccin varan segn la edad y otrosfactores de [place]. Se recomienda realizar la prueba de deteccin si: Es mayor de 55 aos de edad. Si usted tiene entre 55 y 69, hable con el mdico sobre la necesidad de realizarse una prueba de deteccin y qu frecuencia debe Futures trader. Debido a que la Harley[employer]Davidson de los cnceres de prstata crecen lentamente y no causan la muerte, generalmente la prueba de deteccin se reserva en este grupo etario para los hombres que tienen una expectativa de vida de entre 10 y 15 aos. Es Adult nurse de 55 aos y tiene estos factores de riesgo: Ser un hombre negro o un hombre de ascendencia africana. Tener un padre, un hermano o un to que han sido diagnosticados con cncer de prstata. El riesgo es mayor si su familiar tuvo cncer a una edad temprana. No se recomienda realizar la prueba de deteccin si: Es Adult nurse de 40 aos. Tiene entre 40 y 44 aos y no tiene factores de Chief of Staff. Tiene 70 aos o ms. A esta edad, los riesgos que puede causar la prueba de deteccin son S[employer] los beneficios que puede proporcionar. Si tiene Conservator, museum/gallery de cncer de prstata, el mdico puede recomendarle que se realice pruebas de deteccin con ms frecuencia o que comience a realizrselasa una edad ms temprana. Cmo se realiza la prueba de deteccin del cncer de prstata? La prueba de deteccin de  cncer de prstata recomendada es un anlisis de sangre llamada prueba del antgeno prosttico especfico (prostate-specific antigen, PSA). El PSA es una protena que se produce en la prstata. A medida que usted envejece, la prstata produce naturalmente ms PSA. Los niveles muy altos de PSA pueden deberse a lo siguiente: Cncer de prstata. El agrandamiento de la prstata no causado por el cncer (hiperplasia prosttica benigna, HPB). Esta afeccin es muy frecuente en hombres de edad avanzada. Una infeccin de la prstata (prostatitis). Segn los Norfolk Southern del PSA, es posible que necesite someterse a ms pruebas, por ejemplo: Un examen fsico para verificar el tamao de la prstata. Anlisis de [place] y pruebas de diagnstico por imgenes. Un procedimiento para extraer muestras de tejido de la prstata para analizarlas (biopsia). Cules son los beneficios de la prueba de deteccin de cncer de prstata? La prueba de deteccin puede ayudar a Ecologist en una etapa temprana, antes de que comiencen los sntomas y cuando se puede tratar con mayor facilidad. Hay una probabilidad pequea de que la prueba de deteccin pueda reducir el riesgo de morir a causa del cncer de prstata. La posibilidad es pequea porque el cncer de prstata es un cncer de crecimiento lento y la mayora de los hombres con cncer de prstata fallecen debido a otra causa. Cules son los riesgos de la prueba de Airline pilot de cncer de prstata? El riesgo principal de la prueba de Airline pilot de cncer de prstata es el diagnstico y [place]  tratamiento de un cncer de prstata que nunca habra causado ningn sntoma ni problema. Esto se denomina sobrediagnsticoy sobretratamiento. La prueba de deteccin del PSA no puede indicarle si su PSA es alto debido al cncer o a otra causa. Una biopsia de prstata es el nico procedimiento para Arts administrator de prstata. Es posible que incluso los resultados de una biopsia no  puedan indicarle si el cncer se debe tratar. Es posible que el cncer de prstata de crecimiento lento no necesite ningn tratamiento excepto el control; por lo tanto el diagnstico y el tratamientopueden provocar un estrs innecesario u otros efectos secundarios. Una biopsia de prstata tambin puede causar: Infeccin o fiebre. Un resultado negativo falso. Este es un resultado que muestra que usted no tiene cncer de prstata cuando en realidad lo tiene. Preguntas para hacerle al mdico Cundo debo comenzar a realizarme pruebas de deteccin del cncer de prstata? Cul es mi riesgo de Warehouse manager cncer de prstata? Con qu frecuencia tengo que realizarme pruebas de deteccin? Qu tipos de pruebas de deteccin necesito? Cmo obtengo los resultados de la prueba? Qu significan los resultados? Necesito tratamiento? Dnde buscar ms informacin American Cancer Society (Sociedad Americana contra el Cncer): www.cancer.org American Urological Association (Asociacin Jacques Navy): www.auanet.org Comunquese con un mdico si: Tiene dificultad para orinar. Siente dolor al Beatrix Shipper o al Ryder System. Observa sangre en la orina o en el semen. Siente dolor en la espalda o en la zona de la prstata. Resumen El cncer de prstata es un tipo comn de cncer en los hombres. La prstata se encuentra debajo de la vejiga y delante del recto. Esta glndula agrega lquido en el semen durante la eyaculacin. Una prueba de deteccin de cncer de prstata puede ayudar a identificar el cncer en una etapa temprana, cuando se puede tratar con ms facilidad. La prueba del antgeno prosttico especfico (PSA) es una prueba de deteccin del cncer de prstata. Analice los riesgos y beneficios de las pruebas de Airline pilot del cncer de prstata con el mdico. Si tiene 70 aos o ms, los riesgos que puede causar la prueba de deteccin son Sonic Automotive los beneficios que puede proporcionar. Esta informacin no tiene  Theme park manager el consejo del mdico. Asegresede hacerle al mdico cualquier pregunta que tenga. Document Revised: 07/07/2019 Document Reviewed: 07/07/2019 Elsevier Patient Education  2022 [employer].

## 2021-06-13 NOTE — Progress Notes (Signed)
   06/13/21 2:04 PM   Domingo Pulse [place] Bartt Gonzaga May 30, 1966 [id number]  CC: Prostatitis, family history of prostate cancer  HPI: I saw Mr. Jared Foster in clinic today for evaluation of the above issues.  Today's visit was conducted with a Engineer, structural.  He is a 55 year old male with a family history of lethal prostate cancer who presented to the ED on 05/18/2021 with a bulge near the rectum and urinary frequency and dysuria.  Urinalysis was benign, and CT showed no acute abnormalities aside from a mildly enlarged prostate.  He was treated with Cipro with complete resolution of his urinary symptoms. There are no prior PSA values to review.  He denies any significant urinary symptoms prior to this episode.  He denies any gross hematuria or persistent urinary symptoms.  No prior episodes of UTIs or infections.  IPSS score today is 1 with quality of life delighted, urinalysis is totally benign, and PVR is normal at 57 mL.  He saw general surgery recently for external hemorrhoids and DRE was benign with an enlarged but benign feeling prostate   Surgical History: Past Surgical History:  Procedure Laterality Date   APPENDECTOMY     COLONOSCOPY WITH PROPOFOL N/A 09/01/2017   Procedure: COLONOSCOPY WITH PROPOFOL;  Surgeon: Wyline Mood, MD;  Location: [facility] ENDOSCOPY;  Service: Gastroenterology;  Laterality: N/A;     Social History:  reports that he has never smoked. He has never used smokeless tobacco. He reports that he does not drink alcohol and does not use drugs.  Physical Exam: BP (!) 153/84 (BP Location: Left Arm, Patient Position: Sitting, Cuff Size: Large)   Pulse 99   Ht 5\' 10"  (1.778 m)   Wt 230 lb (104.3 kg)   BMI 33.00 kg/m    Constitutional:  Alert and oriented, No acute distress. Cardiovascular: No clubbing, cyanosis, or edema. Respiratory: Normal respiratory effort, no increased work of breathing. GI: Abdomen is soft, nontender, nondistended, no abdominal  masses  Laboratory Data: Reviewed, see HPI  Pertinent Imaging: I have personally viewed and interpreted the CT abdomen and pelvis dated 05/18/2021 that shows a 73 g prostate but no other abnormalities.  Assessment & Plan:   55 year old male with family history of lethal prostate cancer in his father, and recent episode of likely prostatitis based on his clinical improvement on Cipro.  Urinalysis and PVR benign today.  DRE benign with general surgery last week.  We discussed the risks and benefits of PSA screening, and with his family history I certainly recommended screening PSA.  In the setting of his recent infection I recommended waiting at least a few months before his initial PSA.  RTC 6 months with PSA prior  41, MD 06/13/2021  [facility] [address] [place], [place] [phone]

## 2021-10-11 ENCOUNTER — Other Ambulatory Visit: Payer: 59

## 2021-10-11 ENCOUNTER — Other Ambulatory Visit: Payer: Self-pay

## 2021-10-11 DIAGNOSIS — Z125 Encounter for screening for malignant neoplasm of prostate: Secondary | ICD-10-CM

## 2021-10-12 LAB — PSA: Prostate Specific Ag, Serum: 5.1 ng/mL — ABNORMAL HIGH (ref 0.0–4.0)

## 2021-10-17 ENCOUNTER — Ambulatory Visit: Payer: 59 | Admitting: Urology

## 2021-10-24 ENCOUNTER — Ambulatory Visit (INDEPENDENT_AMBULATORY_CARE_PROVIDER_SITE_OTHER): Payer: 59 | Admitting: Urology

## 2021-10-24 ENCOUNTER — Encounter: Payer: Self-pay | Admitting: Urology

## 2021-10-24 ENCOUNTER — Other Ambulatory Visit: Payer: Self-pay

## 2021-10-24 VITALS — BP 149/80 | HR 92 | Ht 70.0 in | Wt 223.8 lb

## 2021-10-24 DIAGNOSIS — R972 Elevated prostate specific antigen [PSA]: Secondary | ICD-10-CM | POA: Diagnosis not present

## 2021-10-24 NOTE — Patient Instructions (Addendum)
evite eyacular o andar en bicicleta antes de la extraccin de sangre  Prueba de deteccin del cncer de prstata Prostate Cancer Screening La prueba de deteccin del cncer de prstata es una prueba que se realiza para detectar la presencia de cncer de prstata en los hombres. La prstata es una glndula del tamao de una nuez que se encuentra debajo de la vejiga y delante del recto en los hombres. La funcin de la prstata es agregar lquido en el semen Teacher, English as a foreign language. El cncer de prstata es uno de los tipos ms comunes de [employer] Companies. Quin debe realizarse estudios para la deteccin de cncer de prstata? Las United Auto la realizacin de una prueba de deteccin varan segn la edad y otros factores de [employer], as como entre las organizaciones profesionales que [employer] recomendaciones. En general, se recomienda realizarse una prueba de deteccin si: Tiene entre 50 y 55 aos y un riesgo promedio de Database administrator de prstata. Debe hablar con el mdico sobre la necesidad de realizarse una prueba de deteccin y con qu frecuencia debe Futures trader. Debido a que la Harley-Davidson de los cnceres de prstata crecen lentamente y no causan la muerte, generalmente las pruebas de Airline pilot se reservan en este grupo etario para los hombres que tienen una expectativa de vida de entre 10 y 15 aos. Es Adult nurse de 50 aos y tiene estos factores de riesgo: Tener un padre, un hermano o un to que han sido diagnosticados con cncer de prstata. El riesgo es mayor si el cncer de su familiar se produjo a una edad temprana o si tiene varios familiares con cncer de prstata a una edad temprana. Ser un hombre negro o de ascendencia caribea o africana subsahariana. En general, no se recomienda realizarse una prueba de deteccin si: Es Adult nurse de 40 aos. Tiene entre 40 y 82 aos y no tiene factores de Chief of Staff. Tiene 70 aos o ms. A esta edad, los riesgos que puede causar la prueba de deteccin son  [employer] los beneficios que puede proporcionar. Si tiene Conservator, museum/gallery de cncer de prstata, el mdico puede recomendarle que se realice pruebas de deteccin con ms frecuencia o que comience a realizrselas a una edad ms temprana. Cmo se realiza la prueba de deteccin del cncer de prstata? La prueba de deteccin de cncer de prstata recomendada es un anlisis de sangre llamada prueba del antgeno prosttico especfico (prostate-specific antigen, PSA). El PSA es una protena que se produce en la prstata. A medida que usted envejece, la prstata produce naturalmente ms PSA. Los niveles muy altos de PSA pueden deberse a lo siguiente: Cncer de prstata. El agrandamiento de la prstata no causado por el cncer (hiperplasia prosttica benigna, HPB). Esta afeccin es muy frecuente en hombres de edad avanzada. Infeccin de la prstata (prostatitis) o infeccin de las vas urinarias. Ciertos medicamentos, como las hormonas masculinas (testosterona) u otros medicamentos que pueden aumentar los niveles de [place]. Es posible que se realice un examen rectal como parte de una prueba de deteccin del cncer de prstata para ayudar a proporcionar informacin sobre el tamao de la prstata. Cuando se Teacher, music, debe hacerse despus de Doctor, general practice de PSA para evitar cualquier [employer]. Segn los [employer] del PSA, es posible que necesite someterse a ms pruebas, por ejemplo: Un examen fsico para verificar el tamao de la prstata, si no se realiz como parte de la prueba de deteccin. Anlisis de [place] y pruebas de diagnstico por imgenes.  Un procedimiento para extraer muestras de tejido de la prstata para analizarlas (biopsia). Esta es la nica forma de saber con certeza si tiene cncer de prstata. Cules son los beneficios de la prueba de deteccin de cncer de prstata? La prueba de deteccin puede ayudar a Ecologist en una etapa temprana,  antes de que comiencen los sntomas y cuando se puede tratar con mayor facilidad. Hay una probabilidad pequea de que la prueba de deteccin pueda reducir el riesgo de morir a causa del cncer de prstata. La posibilidad es pequea porque el cncer de prstata es un cncer de crecimiento lento y la mayora de los hombres con cncer de prstata fallecen debido a otra causa. Cules son los riesgos de la prueba de Airline pilot de cncer de prstata? El riesgo principal de la prueba de Airline pilot de cncer de prstata es el diagnstico y el tratamiento de un cncer de prstata que nunca habra causado ningn sntoma ni problema. Esto se denomina sobrediagnsticoy sobretratamiento. La prueba de deteccin del PSA no puede indicarle si su PSA es alto debido al cncer o a otra causa. Una biopsia de prstata es el nico procedimiento para Arts administrator de prstata. Es posible que incluso los resultados de una biopsia no puedan indicarle si el cncer se debe tratar. Es posible que el cncer de prstata de crecimiento lento no necesite ningn tratamiento excepto el control; por lo tanto el diagnstico y Scientist, research (medical) pueden provocar un estrs innecesario u otros efectos secundarios. Preguntas para hacerle al mdico Cundo debo comenzar a realizarme pruebas de deteccin del cncer de prstata? Cul es mi riesgo de Warehouse manager cncer de prstata? Con qu frecuencia tengo que realizarme pruebas de deteccin? Qu tipos de pruebas de deteccin necesito? Cmo obtengo los resultados de la prueba? Qu significan los resultados? Necesito tratamiento? Dnde buscar ms informacin American Cancer Society (Sociedad Americana contra el Cncer): www.cancer.org American Urological Association (Asociacin Jacques Navy): www.auanet.org Comunquese con un mdico si: Tiene dificultad para orinar. Siente dolor al Beatrix Shipper o al [employer]. Observa sangre en la orina o en el semen. Siente dolor en la espalda o en la  zona de la prstata. Resumen El cncer de prstata es un tipo comn de cncer en los hombres. La prstata se encuentra debajo de la vejiga y delante del recto. Esta glndula agrega lquido en el semen durante la eyaculacin. La prueba de deteccin del cncer de prstata puede identificar el cncer en una fase temprana, cuando el cncer puede tratarse con ms facilidad y es menos probable que se haya extendido a otras zonas del cuerpo. La prueba del antgeno prosttico especfico (PSA) es la prueba recomendada de deteccin del cncer de prstata, pero tiene riesgos asociados. Analice los riesgos y beneficios de las pruebas de Airline pilot del cncer de prstata con el mdico. Si tiene 70 aos o ms, los riesgos que puede causar la prueba de deteccin son [employer] los beneficios que puede proporcionar. Esta informacin no tiene Theme park manager el consejo del mdico. Asegrese de hacerle al mdico cualquier pregunta que tenga. Document Revised: 05/10/2021 Document Reviewed: 05/10/2021 Elsevier Patient Education  2022 Elsevier Inc.  Prueba del antgeno prosttico especfico Prostate-Specific Antigen Test Por qu me debo realizar esta prueba? La prueba del antgeno prosttico especfico (APE) es una prueba de deteccin del cncer de prstata. Esta prueba puede identificar signos tempranos del cncer de prstata, lo cual puede permitir la deteccin prematura y un tratamiento ms eficaz. Su mdico puede recomendarle que se realice la  prueba del APE a partir de los 50 aos de edad o antes si tiene un riesgo ms alto de Geophysical data processor de prstata. Tambin pueden realizarle la prueba del APE: Para controlar el tratamiento del cncer de prstata. Para saber si el cncer de prstata ha regresado despus del tratamiento. Qu se analiza? Esta prueba mide la cantidad de APE en la sangre. El APE es una protena que se produce en la prstata. La prstata produce naturalmente ms APE a medida que usted  envejece, pero los niveles muy altos de APE pueden ser un signo de una afeccin mdica. Qu tipo de [place] se toma? Para esta prueba, se extrae Lauris Poag de [place]. Por lo general, se extrae introduciendo una aguja en un vaso sanguneo pero tambin se puede pinchar un dedo con una aguja pequea. Para esta prueba, debe extraerse sangre antes de realizar un examen de la prstata que incluya un examen rectal digital para no modificar los [place]. Cmo debo prepararme para esta prueba? No eyacule 24 horas antes de la prueba o como se lo haya indicado el mdico, ya que esto puede producir una elevacin en el APE. No se someta a ningn procedimiento que requiera la manipulacin de la prstata, como biopsia o [place], durante 6 semanas antes de realizarse la prueba, ya que esto puede causar una elevacin del APE. Informe al mdico acerca de lo siguiente: Si tiene signos de otras afecciones que pueden [employer] niveles del APE, por ejemplo: El agrandamiento de la prstata no causado por el cncer (hiperplasia prosttica benigna, HPB). Esta afeccin es muy frecuente en hombres de edad avanzada. Una infeccin en la prstata o las vas [place]. Cualquier alergia que tenga. Todos los [employer]apeake Energy [place], incluidos vitaminas, hierbas, gotas oftlmicas, cremas y [address] de [address]. Esto tambin incluye: Medicamentos para ayudar con el crecimiento del cabello, como la finasterida. Cualquier exposicin reciente a un medicamento llamado dietilestilbestrol (DES). Medicamentos, como las hormonas masculinas (testosterona) u otros medicamentos que pueden aumentar los niveles de [place]. Cualquier problema de la sangre que tenga. Cualquier procedimiento reciente al que se haya sometido, especialmente procedimientos que involucren la prstata o el recto. Cualquier afeccin mdica que tenga. Cmo se informan los resultados? Los resultados de la prueba se informan como un valor que indica qu  cantidad de APE tiene en la [place]. Este valor se proporciona como nanogramos de APE por mililitro de sangre (ng/ml). Su mdico comparar sus resultados con los rangos normales que se establecieron luego de Education officer, environmental la prueba a un grupo grande de personas (rangos de referencia). Los rangos de referencia pueden variar entre distintos laboratorios y hospitales. Los niveles de APE varan de Neomia Dear persona a [place] y, por lo general, aumentan con la edad. Debido a esta variacin, no hay un solo valor de APE que se considere normal para todas [employer]. En lugar de esto, se utilizan los rangos de referencia de APE para describir si sus niveles de APE se consideran bajos o altos (elevados). Los rangos de referencia habituales son los siguientes: Bajo: 0 a 2.5 ng/ml. Leve a moderadamente elevado: 2.6 a 10.0 ng/ml. Moderadamente elevado: 10.0 a 19.9 ng/ml. Considerablemente elevado: 20 ng/ml o ms. Qu significan los resultados? Un resultado de la prueba superior a 4 ng/ml puede indicar que tiene cncer de prstata. Sin embargo, la prueba de APE por s sola no es suficiente para Consulting civil engineer de prstata. Los [employer] Electric Power de APE tambin pueden atribuirse al proceso de envejecimiento natural, una infeccin en la prstata (  prostatitis) o HPB. La prueba de deteccin del APE no puede indicarle si su APE es alto debido al cncer o a otra causa. Una biopsia de prstata es la nica manera de diagnosticar el cncer de prstata. Un riesgo de Intel prueba de APE es el diagnstico y tratamiento de un cncer de prstata que nunca habra causado ningn sntoma ni problema (sobrediagnstico y sobretratamiento). Hable con su mdico sobre lo que significan sus [place]. En algunos casos, el mdico puede hacer ms pruebas para [employer]. Preguntas para hacerle al mdico Consulte a su mdico o pregunte en el departamento donde se realiza la prueba acerca de lo siguiente: Cundo estarn disponibles  mis resultados? Cmo obtendr mis resultados? Cules son las opciones de tratamiento? Qu otras pruebas necesito? Cules son los prximos pasos que debo seguir? Resumen La prueba del antgeno prosttico especfico (APE) es una prueba de deteccin del cncer de prstata. Su mdico puede recomendarle que se realice la prueba del APE a partir de los 50 aos de edad o antes si tiene un riesgo ms alto de Geophysical data processor de prstata. Un resultado de la prueba superior a 4 ng/ml puede indicar que tiene cncer de prstata. Sin embargo, la causa de los niveles elevados puede ser una cantidad de afecciones que no se relacionan con el cncer de prstata. Hable con su mdico sobre lo que significan sus [place]. Esta informacin no tiene Theme park manager el consejo del mdico. Asegrese de hacerle al mdico cualquier pregunta que tenga. Document Revised: 03/21/2021 Document Reviewed: 03/21/2021 Elsevier Patient Education  2022 Elsevier Inc.  Biopsia de prstata guiada por ecografa transrectal Transrectal Ultrasound-Guided Prostate Biopsy La biopsia de la prstata guiada por ecografa transrectal es un procedimiento para tomar muestras de tejido de la prstata para examinar. La prstata es una glndula del tamao de una nuez que se encuentra por debajo de la vejiga y en frente del recto. Durante este procedimiento, un dispositivo pequeo (sonda) se lubrica y se coloca dentro del recto. La sonda emite ondas sonoras que crean una imagen de la prstata y los tejidos que la rodean (ecografa transrectal). Las imgenes se utilizan para Contractor a Animator de extraccin de [employer]. Se toman muestras de tejido para examinar en un laboratorio y Engineer, manufacturing si hay cncer de prstata. Generalmente, este procedimiento se realiza para Systems analyst prstata de los hombres que tienen niveles altos (elevados) de antgeno prosttico especfico (prostate-specific antigen, PSA) que puede ser un signo de  cncer de prstata o de agrandamiento de la prstata relacionado con la edad (hiperplasia prosttica benigna, o HPB). Informe al mdico acerca de lo siguiente: Cualquier alergia que tenga. Todos los [employer] [place], incluidos vitaminas, hierbas, gotas oftlmicas, cremas y [address] de [address]. Problemas previos que usted o algn miembro de su familia hayan tenido con los anestsicos. Cualquier problema de la sangre que tenga. Cirugas a las que se haya sometido. Cualquier afeccin mdica que tenga. Infecciones previas de la prstata. Cules son los riesgos? En general, se trata de un procedimiento seguro. Sin embargo, pueden ocurrir complicaciones, por ejemplo: Infeccin prosttica. Sangrado que proviene del recto. Presencia de [employer]. Reacciones alrgicas a los medicamentos. Dao a las estructuras circundantes, como vasos sanguneos, rganos o msculos. Tener dificultad para Geographical information systems officer. Dao nervioso. Esto suele ser transitorio. Qu ocurre antes del procedimiento? Medicamentos Consulte al mdico si debe hacer o no lo siguiente: Multimedia programmer o suspender los medicamentos que [place] habitualmente. Esto es muy importante si toma medicamentos  para la diabetes o anticoagulantes. Tomar medicamentos como aspirina e ibuprofeno. Estos medicamentos pueden tener un efecto anticoagulante en la [place]. No tome estos medicamentos a menos que el mdico se lo indique. Usar medicamentos de venta libre, vitaminas, hierbas y suplementos. Indicaciones generales Siga las instrucciones del mdico respecto de las comidas y las bebidas. En la [employer], no necesitar dejar de comer y beber por completo antes del procedimiento. Le administrarn un enema. Durante un enema, se inyecta lquido en el recto para que se eliminen los desechos. Pueden extraerle Lauris Poag de [place] o pedirle [place] de [place]. Pregntele al mdico qu medidas se tomarn para ayudar a prevenir una infeccin.  Estas medidas pueden incluir las siguientes: Lavar la piel con un jabn antisptico. Recibir antibiticos. Si va a marcharse a su casa inmediatamente despus del procedimiento, pdale a un adulto responsable que: Lo lleve a su casa desde el hospital o la clnica. No se le permitir conducir. Lo cuide durante el [employer] Energy indiquen. Qu ocurre durante el procedimiento?  Le colocarn una va intravenosa (i.v.) en una vena. Le administrarn uno de los siguientes medicamentos o ambos: Un medicamento para ayudar a Lexicographer (sedante). Un medicamento para adormecer la zona (anestesia local). Lo colocarn acostado sobre el costado izquierdo y las rodillas flexionadas hacia el pecho. Se introducir una sonda con gel lubricante en el recto y se tomarn imgenes de la prstata y las estructuras circundantes. Le inyectarn un anestsico en la prstata. Se introducir una aguja para biopsia por el recto o el perineo que se guiar hasta la prstata mediante el uso de imgenes ecogrficas. Se tomarn muestras del tejido de la prstata y luego se retirarn la aguja y la sonda. Las muestras para la biopsia se enviarn a un laboratorio para ser [employer]. El procedimiento puede variar segn el mdico y el hospital. Ladell Heads ocurre despus del procedimiento? Le controlarn la presin arterial, la frecuencia cardaca, la frecuencia respiratoria y Air cabin crew de oxgeno en la sangre hasta que le den el alta del hospital o la clnica. Tal vez tenga ciertas molestias en el rea rectal. Le darn medicamentos para el dolor si los necesita. Si le administraron un sedante durante el procedimiento, puede afectarla por varias horas. No conduzca ni opere maquinaria hasta que el mdico le indique que es seguro [place]. Es su responsabilidad retirar Starbucks Corporation del procedimiento. Pregntele a su mdico, o a un miembro del personal del departamento donde se realice el procedimiento, cundo estarn [employer]. Concurra a todas las visitas de seguimiento. Esto es importante. Resumen Una biopsia transrectal guiada por ecografa toma muestras de tejido de la prstata usando ondas sonoras guiadas por ecografa para ayudar a Animator. Generalmente, este procedimiento se realiza para Systems analyst prstata de los hombres que tienen niveles altos (elevados) de antgeno prosttico especfico (PSA) que puede ser un signo de cncer de prstata o de agrandamiento de la prstata relacionado con la edad. Despus del procedimiento, es posible que tenga ciertas molestias en el rea rectal. Haga que un adulto responsable lo lleve a casa desde el hospital o la clnica y haga un seguimiento con el mdico para Engineering geologist [place]. Esta informacin no tiene Theme park manager el consejo del mdico. Asegrese de hacerle al mdico cualquier pregunta que tenga. Document Revised: 05/10/2021 Document Reviewed: 05/10/2021 Elsevier Patient Education  2022 [employer].

## 2021-10-24 NOTE — Progress Notes (Signed)
° °  10/24/2021 4:24 PM   Jared Foster January 03, 1966 [id number]  Reason for visit: Follow up of prostatitis, family history of prostate cancer, elevated PSA  HPI: I saw Jared Foster in clinic today for the above issues.  Briefly, 55 year old male with a family history of lethal prostate cancer who had an episode of severe prostatitis in July 2022 that resolved with a course of Cipro.  There were no PSA values prior.  DRE at that time showed an enlarged prostate, but no nodules or masses, and prostate measured 73 g on CT at that time.  He denies any urinary symptoms today.  He has been having some left leg pain over the last few weeks.  I recommended follow-up in 6 months with a PSA prior to allow time for the PSA to drop back down to normal after his episode of severe prostatitis.  For unclear reasons, a PSA was drawn on 10/11/2021 and was mildly elevated at 5.1.  This is not surprising with his history of severe prostatitis a few months ago.  We reviewed the risks and benefits of PSA screening, as well as possible etiologies of the mild elevation including recent history of prostatitis.  I recommended a repeat PSA in 6 to 8 weeks with reflex to free.  If PSA remains elevated, consider prostate MRI or prostate biopsy.  RTC 6 to 8 weeks repeat PSA with reflex to free   Sondra Come, MD  [facility] [address] [place], [place] [address] [phone]

## 2021-12-11 ENCOUNTER — Ambulatory Visit: Payer: 59 | Admitting: Internal Medicine

## 2021-12-12 ENCOUNTER — Other Ambulatory Visit: Payer: 59

## 2021-12-12 ENCOUNTER — Other Ambulatory Visit: Payer: Self-pay

## 2021-12-12 DIAGNOSIS — R972 Elevated prostate specific antigen [PSA]: Secondary | ICD-10-CM

## 2021-12-13 LAB — PSA TOTAL (REFLEX TO FREE): Prostate Specific Ag, Serum: 4 ng/mL (ref 0.0–4.0)

## 2021-12-13 LAB — FPSA% REFLEX
% FREE PSA: 17.5 %
PSA, FREE: 0.7 ng/mL

## 2021-12-18 ENCOUNTER — Ambulatory Visit (INDEPENDENT_AMBULATORY_CARE_PROVIDER_SITE_OTHER)
Admission: RE | Admit: 2021-12-18 | Discharge: 2021-12-18 | Disposition: A | Discharge status: Home / Self Care | Payer: 59 | Source: Ambulatory Visit | Attending: Family | Admitting: Family

## 2021-12-18 ENCOUNTER — Ambulatory Visit (INDEPENDENT_AMBULATORY_CARE_PROVIDER_SITE_OTHER): Payer: 59 | Admitting: Urology

## 2021-12-18 ENCOUNTER — Other Ambulatory Visit: Payer: Self-pay

## 2021-12-18 ENCOUNTER — Encounter: Payer: Self-pay | Admitting: Family

## 2021-12-18 ENCOUNTER — Ambulatory Visit (INDEPENDENT_AMBULATORY_CARE_PROVIDER_SITE_OTHER): Payer: 59 | Admitting: Family

## 2021-12-18 ENCOUNTER — Encounter: Payer: Self-pay | Admitting: Urology

## 2021-12-18 VITALS — BP 145/88 | HR 83 | Ht 70.0 in | Wt 225.0 lb

## 2021-12-18 VITALS — BP 132/84 | HR 86 | Ht 70.0 in | Wt 228.0 lb

## 2021-12-18 DIAGNOSIS — M25552 Pain in left hip: Secondary | ICD-10-CM | POA: Insufficient documentation

## 2021-12-18 DIAGNOSIS — M25562 Pain in left knee: Secondary | ICD-10-CM | POA: Insufficient documentation

## 2021-12-18 DIAGNOSIS — R972 Elevated prostate specific antigen [PSA]: Secondary | ICD-10-CM

## 2021-12-18 DIAGNOSIS — E785 Hyperlipidemia, unspecified: Secondary | ICD-10-CM | POA: Diagnosis not present

## 2021-12-18 DIAGNOSIS — R739 Hyperglycemia, unspecified: Secondary | ICD-10-CM

## 2021-12-18 DIAGNOSIS — N410 Acute prostatitis: Secondary | ICD-10-CM | POA: Diagnosis not present

## 2021-12-18 DIAGNOSIS — K648 Other hemorrhoids: Secondary | ICD-10-CM | POA: Diagnosis not present

## 2021-12-18 DIAGNOSIS — Z23 Encounter for immunization: Secondary | ICD-10-CM | POA: Diagnosis not present

## 2021-12-18 DIAGNOSIS — Z0001 Encounter for general adult medical examination with abnormal findings: Secondary | ICD-10-CM | POA: Insufficient documentation

## 2021-12-18 DIAGNOSIS — N200 Calculus of kidney: Secondary | ICD-10-CM

## 2021-12-18 DIAGNOSIS — N400 Enlarged prostate without lower urinary tract symptoms: Secondary | ICD-10-CM | POA: Insufficient documentation

## 2021-12-18 LAB — CBC WITH DIFFERENTIAL/PLATELET
Basophils Absolute: 0.1 10*3/uL (ref 0.0–0.1)
Basophils Relative: 1.1 % (ref 0.0–3.0)
Eosinophils Absolute: 0.2 10*3/uL (ref 0.0–0.7)
Eosinophils Relative: 2.7 % (ref 0.0–5.0)
HCT: 42 % (ref 39.0–52.0)
Hemoglobin: 14.1 g/dL (ref 13.0–17.0)
Lymphocytes Relative: 28.8 % (ref 12.0–46.0)
Lymphs Abs: 1.9 10*3/uL (ref 0.7–4.0)
MCHC: 33.5 g/dL (ref 30.0–36.0)
MCV: 87.3 fl (ref 78.0–100.0)
Monocytes Absolute: 0.6 10*3/uL (ref 0.1–1.0)
Monocytes Relative: 8.5 % (ref 3.0–12.0)
Neutro Abs: 3.8 10*3/uL (ref 1.4–7.7)
Neutrophils Relative %: 58.9 % (ref 43.0–77.0)
Platelets: 207 10*3/uL (ref 150.0–400.0)
RBC: 4.81 Mil/uL (ref 4.22–5.81)
RDW: 13.4 % (ref 11.5–15.5)
WBC: 6.5 10*3/uL (ref 4.0–10.5)

## 2021-12-18 NOTE — Assessment & Plan Note (Signed)
Patient followed by urologist currently asymptomatic

## 2021-12-18 NOTE — Assessment & Plan Note (Signed)
Lipid panel ordered today pending results. °

## 2021-12-18 NOTE — Assessment & Plan Note (Addendum)
Suspected Baker's cyst have ordered an ultrasound of the knee pending results as well as a left knee x-ray pending results.  Have referred patient to orthopedics for ongoing evaluation

## 2021-12-18 NOTE — Assessment & Plan Note (Signed)
Patient repeated PSA last week and has follow-up with urologist today for ongoing evaluation and treatment.

## 2021-12-18 NOTE — Progress Notes (Signed)
° °  12/18/2021 3:39 PM   Jared Foster Apr 01, 1966 [id number]  Reason for visit: Follow up of prostatitis, family history of prostate cancer, elevated PSA  HPI: I saw Jared Foster in clinic today for the above issues.  Briefly, 56 year old male with a family history of lethal prostate cancer who had an episode of severe prostatitis in July 2022 that resolved with a course of Cipro.  There were no PSA values prior.  DRE at that time showed an enlarged prostate, but no nodules or masses, and prostate measured 73 g on CT at that time.  At our last visit in December 2022 his urinary symptoms had completely resolved.  A PSA had been checked in early December and was mildly elevated at 5.1 which was not surprising with his history of severe prostatitis a few months prior.  With his family history of prostate cancer, I recommended close follow-up with a repeat PSA.  PSA has continued to downtrend, most recently 4.0(17.5% free), and reassuring PSA density of 0.05.  We reviewed the AUA guidelines regarding PSA screening with his family history of prostate cancer, and I recommended repeat PSA with reflex to free in 6 months, call with results.  If remains normal at that time can resume yearly PSA screening.   Sondra Come, MD  [facility] [address] [place], [place] [address] [phone]

## 2021-12-18 NOTE — Patient Instructions (Signed)
Prueba de deteccin del cncer de prstata Prostate Cancer Screening La prueba de deteccin del cncer de prstata es una prueba que se realiza para detectar la presencia de cncer de prstata en los hombres. La prstata es una glndula del tamao de una nuez que se encuentra debajo de la vejiga y delante del recto en los hombres. La funcin de la prstata es agregar lquido en el semen Holiday representative. El cncer de prstata es uno de los tipos ms comunes de [employer]. Quin debe realizarse estudios para la deteccin de cncer de prstata? Las Dillard's la realizacin de una prueba de deteccin varan segn la edad y otros factores de [place], as como entre las organizaciones profesionales que Dillard's recomendaciones. En general, se recomienda realizarse una prueba de deteccin si: Tiene entre 37 y 55 aos y un riesgo promedio de Hotel manager de prstata. Debe hablar con el mdico sobre la necesidad de realizarse una prueba de deteccin y con qu frecuencia debe Radiation protection practitioner. Debido a que la [employer] de los cnceres de prstata crecen lentamente y no causan la muerte, generalmente las pruebas de Programme researcher, broadcasting/film/video se reservan en este grupo etario para los hombres que tienen una expectativa de vida de entre 10 y 75 aos. Es Garment/textile technologist de 36 aos y tiene estos factores de riesgo: Tener un padre, un hermano o un to que han sido diagnosticados con cncer de prstata. El riesgo es mayor si el cncer de su familiar se produjo a una edad temprana o si tiene varios familiares con cncer de prstata a una edad temprana. Ser un hombre negro o de ascendencia caribea o africana subsahariana. En general, no se recomienda realizarse una prueba de deteccin si: Es Garment/textile technologist de 40 aos. Tiene entre 3 y 93 aos y no tiene factores de Sales executive. Tiene 70 aos o ms. A esta edad, los riesgos que puede causar la prueba de deteccin son [employer] los beneficios que puede proporcionar. Si tiene Public affairs consultant de  cncer de prstata, el mdico puede recomendarle que se realice pruebas de deteccin con ms frecuencia o que comience a realizrselas a una edad ms temprana. Cmo se realiza la prueba de deteccin del cncer de prstata? La prueba de deteccin de cncer de prstata recomendada es un anlisis de sangre llamada prueba del antgeno prosttico especfico (prostate-specific antigen, PSA). El PSA es una protena que se produce en la prstata. A medida que usted envejece, la prstata produce naturalmente ms PSA. Los niveles muy altos de PSA pueden deberse a lo siguiente: Cncer de prstata. El agrandamiento de la prstata no causado por el cncer (hiperplasia prosttica benigna, HPB). Esta afeccin es muy frecuente en hombres de edad avanzada. Infeccin de la prstata (prostatitis) o infeccin de las vas urinarias. Ciertos medicamentos, como las hormonas masculinas (testosterona) u otros medicamentos que pueden aumentar los niveles de [place]. Es posible que se realice un examen rectal como parte de una prueba de deteccin del cncer de prstata para ayudar a proporcionar informacin sobre el tamao de la prstata. Cuando se Scientist, physiological, debe hacerse despus de Ship broker de PSA para evitar cualquier [employer]. Segn los [employer] del PSA, es posible que necesite someterse a ms pruebas, por ejemplo: Un examen fsico para verificar el tamao de la prstata, si no se realiz como parte de la prueba de deteccin. Anlisis de [place] y pruebas de diagnstico por imgenes. Un procedimiento para extraer muestras de tejido de la prstata para analizarlas (biopsia).  Esta es la nica forma de saber con certeza si tiene cncer de prstata. Cules son los beneficios de la prueba de deteccin de cncer de prstata? La prueba de deteccin puede ayudar a Ecologist en una etapa temprana, antes de que comiencen los sntomas y cuando se puede tratar con mayor  facilidad. Hay una probabilidad pequea de que la prueba de deteccin pueda reducir el riesgo de morir a causa del cncer de prstata. La posibilidad es pequea porque el cncer de prstata es un cncer de crecimiento lento y la mayora de los hombres con cncer de prstata fallecen debido a otra causa. Cules son los riesgos de la prueba de Airline pilot de cncer de prstata? El riesgo principal de la prueba de Airline pilot de cncer de prstata es el diagnstico y el tratamiento de un cncer de prstata que nunca habra causado ningn sntoma ni problema. Esto se denomina sobrediagnsticoy sobretratamiento. La prueba de deteccin del PSA no puede indicarle si su PSA es alto debido al cncer o a otra causa. Una biopsia de prstata es el nico procedimiento para Arts administrator de prstata. Es posible que incluso los resultados de una biopsia no puedan indicarle si el cncer se debe tratar. Es posible que el cncer de prstata de crecimiento lento no necesite ningn tratamiento excepto el control; por lo tanto el diagnstico y Scientist, research (medical) pueden provocar un estrs innecesario u otros efectos secundarios. Preguntas para hacerle al mdico Cundo debo comenzar a realizarme pruebas de deteccin del cncer de prstata? Cul es mi riesgo de Warehouse manager cncer de prstata? Con qu frecuencia tengo que realizarme pruebas de deteccin? Qu tipos de pruebas de deteccin necesito? Cmo obtengo los resultados de la prueba? Qu significan los resultados? Necesito tratamiento? Dnde buscar ms informacin American Cancer Society (Sociedad Americana contra el Cncer): www.cancer.org American Urological Association (Asociacin Jacques Navy): www.auanet.org Comunquese con un mdico si: Tiene dificultad para orinar. Siente dolor al Beatrix Shipper o al [employer] System. Observa sangre en la orina o en el semen. Siente dolor en la espalda o en la zona de la prstata. Resumen El cncer de prstata es un tipo comn  de cncer en los hombres. La prstata se encuentra debajo de la vejiga y delante del recto. Esta glndula agrega lquido en el semen durante la eyaculacin. La prueba de deteccin del cncer de prstata puede identificar el cncer en una fase temprana, cuando el cncer puede tratarse con ms facilidad y es menos probable que se haya extendido a otras zonas del cuerpo. La prueba del antgeno prosttico especfico (PSA) es la prueba recomendada de deteccin del cncer de prstata, pero tiene riesgos asociados. Analice los riesgos y beneficios de las pruebas de Airline pilot del cncer de prstata con el mdico. Si tiene 70 aos o ms, los riesgos que puede causar la prueba de deteccin son [employer] los beneficios que puede proporcionar. Esta informacin no tiene Theme park manager el consejo del mdico. Asegrese de hacerle al mdico cualquier pregunta que tenga. Document Revised: 05/10/2021 Document Reviewed: 05/10/2021 Elsevier Patient Education  2022 [employer].

## 2021-12-18 NOTE — Assessment & Plan Note (Addendum)
Suspected Baker's cyst ultrasound of the knee ordered pending results patient to call [facility] and make appointment.  I have also referred patient to orthopedics for ongoing evaluation patient to wear a knee sleeve for support as well.  Heat and ice and naproxen as needed

## 2021-12-18 NOTE — Patient Instructions (Addendum)
Schedule colonscopy with GI after 08/2022.   Wyline Mood, MD [address], [place], [place], [address] [address], [place], [place], [address] Phone: [phone]  Fax: [phone]  Stop by the lab prior to leaving today. I will notify you of your results once received.   I have ordered a left knee xray and left hip xray for today you can get this prior to leaving.  I have also ordered a left posterior knee ultrasound, pending results. I have ordered this ultrasound for you at [facility] outpatient [facility] center. This order has been sent over for you electronically.  Please call [phone] to schedule this appointment.  I suggest you purchase an over the counter knee sleeve to help with pain, take ibuprofen as needed as well as heat to site.  A referral was placed today for an orthopedist.  Please let us know if you have not heard back within 1 week about your referral.  You will need to return in three months for your second shingles vaccination.   It was a pleasure seeing you today! Please do not hesitate to reach out with any questions and or concerns.  Regards,   Mort Sawyers FNP-C

## 2021-12-18 NOTE — Assessment & Plan Note (Signed)
Ordering left hip x-ray also referred to orthopedics for ongoing evaluation

## 2021-12-18 NOTE — Assessment & Plan Note (Signed)
Incidental finding from CAT scan in the past patient is asymptomatic

## 2021-12-18 NOTE — Progress Notes (Signed)
New Patient Office Visit  Subjective:  Patient ID: Jared Foster, male    DOB: 1966-02-21  Age: 56 y.o. MRN: [id number]  CC:  Chief Complaint  Patient presents with   Knee Injury    Left knee injury 2 years ago--frontal area having sharp pain elevated up left hip, especially when walking.--2 years ago    HPI [facility] Jared Foster is here to establish care as a new patient.  Prior provider was: never, has not had a PCP.  Pt is with acute concerns:   Left knee injury two years ago,  He states he feels pain from front and posterior knee that radiates up to the left hip. Started about six months ago. Intermittent, walks with a limp when it occurs. When he lies on the left side pain is increased and or if movement of the knee to the side it causes pain. Worse with walking as well. Has tried tylenol/ibuprofen, ice and heat without much relief.   Colonscopy 2018,  repeat in five years as with some inflammation that was bx but benign, mild internal hemorrhoids.   chronic concerns:  Seeing Dr. Richardo Hanks, urology, for h/o severe prostatitis that resolved with a course of cipro. DRE did show an enlarged prostate. Did have elevated prostate so will repeat this again today, has appt later today.  Pt denies urinary retention and or frequency.   Pt states no h/o diagnosis as never really saw a PCP.   Past Medical History:  Diagnosis Date   Appendicitis 2003    Past Surgical History:  Procedure Laterality Date   APPENDECTOMY     COLONOSCOPY WITH PROPOFOL N/A 09/01/2017   Procedure: COLONOSCOPY WITH PROPOFOL;  Surgeon: Wyline Mood, MD;  Location: [facility] ENDOSCOPY;  Service: Gastroenterology;  Laterality: N/A;    History reviewed. No pertinent family history.  Social History   Socioeconomic History   Marital status: Significant Other    Spouse name: Not on file   Number of children: 4   Years of education: Not on file   Highest education level: Not on  file  Occupational History   Occupation: electricity  Tobacco Use   Smoking status: Never   Smokeless tobacco: Never  Vaping Use   Vaping Use: Never used  Substance and Sexual Activity   Alcohol use: No   Drug use: No   Sexual activity: Yes    Partners: Male  Other Topics Concern   Not on file  Social History Narrative   Not on file   Social Determinants of Health   Financial Resource Strain: Not on file  Food Insecurity: Not on file  Transportation Needs: Not on file  Physical Activity: Not on file  Stress: Not on file  Social Connections: Not on file  Intimate Partner Violence: Not on file    No outpatient medications prior to visit.   No facility-administered medications prior to visit.    No Known Allergies  ROS Review of Systems  Constitutional:  Negative for chills, fatigue and fever.  Respiratory:  Negative for cough, shortness of breath and wheezing.   Cardiovascular:  Negative for chest pain, palpitations and leg swelling.  Genitourinary:  Negative for difficulty urinating, frequency and urgency.  Musculoskeletal:  Positive for arthralgias (left hip pain), back pain (low back pain, chronic) and joint swelling (left knee with pain on movement). Negative for neck pain.       Objective:    Physical Exam Constitutional:  General: He is not in acute distress.    Appearance: He is obese. He is not ill-appearing, toxic-appearing or diaphoretic.  HENT:     Head: Normocephalic.  Cardiovascular:     Rate and Rhythm: Normal rate and regular rhythm.  Pulmonary:     Effort: Pulmonary effort is normal.     Breath sounds: Normal breath sounds.  Abdominal:     General: Abdomen is flat.  Musculoskeletal:     Lumbar back: Decreased range of motion (painful rom on flexion/extension).     Left hip: No tenderness. Decreased range of motion (painful rotation, extension).     Right knee: Normal.     Left knee: Swelling (areas of swelling and tenderness noted by  reds) present. No erythema or crepitus. Decreased range of motion (tender rom flexion/extension). Tenderness (tenderness palpated medial and lateral aspects knee) present.     Instability Tests: Anterior drawer test negative. Posterior drawer test negative.       Legs:  Skin:    General: Skin is warm.  Neurological:     Mental Status: He is alert.      BP 132/84    Pulse 86    Ht 5\' 10"  (1.778 m)    Wt 228 lb (103.4 kg)    SpO2 98%    BMI 32.71 kg/m  Wt Readings from Last 3 Encounters:  12/18/21 228 lb (103.4 kg)  10/24/21 223 lb 12.8 oz (101.5 kg)  06/13/21 230 lb (104.3 kg)     Health Maintenance Due  Topic Date Due   Zoster Vaccines- Shingrix (1 of 2) Never done   INFLUENZA VACCINE  Never done    There are no preventive care reminders to display for this patient.  No results found for: TSH Lab Results  Component Value Date   WBC 9.5 05/18/2021   HGB 14.2 05/18/2021   HCT 41.2 05/18/2021   MCV 87.1 05/18/2021   PLT 247 05/18/2021   Lab Results  Component Value Date   NA 138 05/18/2021   K 4.2 05/18/2021   CO2 26 05/18/2021   GLUCOSE 95 05/18/2021   BUN 23 (H) 05/18/2021   CREATININE 0.98 05/18/2021   BILITOT 0.7 05/18/2021   ALKPHOS 84 05/18/2021   AST 37 05/18/2021   ALT 50 (H) 05/18/2021   PROT 8.0 05/18/2021   ALBUMIN 4.4 05/18/2021   CALCIUM 9.7 05/18/2021   ANIONGAP 7 05/18/2021   No results found for: CHOL No results found for: HDL No results found for: LDLCALC No results found for: TRIG No results found for: CHOLHDL No results found for: EXNT7G    Assessment & Plan:   Problem List Items Addressed This Visit       Cardiovascular and Mediastinum   Internal hemorrhoids    Stable asymptomatic at current        Genitourinary   Nephrolithiasis    Incidental finding from CAT scan in the past patient is asymptomatic        Other   Encounter for general adult medical examination with abnormal findings - Primary   Relevant Orders   CBC  w/Diff   Comprehensive metabolic panel   Left hip pain    Ordering left hip x-ray also referred to orthopedics for ongoing evaluation      Relevant Orders   DG Hip Unilat W OR W/O Pelvis 2-3 Views Left   Ambulatory referral to Orthopedic Surgery   Posterior knee pain, left    Suspected Baker's cyst have ordered an  ultrasound of the knee pending results as well as a left knee x-ray pending results.  Have referred patient to orthopedics for ongoing evaluation      Relevant Orders   [place] Lower Ext Art Bilat   Ambulatory referral to Orthopedic Surgery   Acute pain of left knee    Suspected Baker's cyst ultrasound of the knee ordered pending results patient to call [facility] and make appointment.  I have also referred patient to orthopedics for ongoing evaluation patient to wear a knee sleeve for support as well.  Heat and ice and naproxen as needed      Relevant Orders   DG Knee Complete 4 Views Left   Ambulatory referral to Orthopedic Surgery   Hyperlipidemia    Lipid panel ordered today pending results.      Relevant Orders   Lipid Profile   Elevated PSA    Patient repeated PSA last week and has follow-up with urologist today for ongoing evaluation and treatment.      Enlarged prostate on rectal examination    Patient followed by urologist currently asymptomatic       No orders of the defined types were placed in this encounter.   Follow-up: Return in about 3 months (around 03/17/2022) for for injection only appt, shingles second vaccination.    Mort Sawyers, FNP

## 2021-12-18 NOTE — Assessment & Plan Note (Signed)
Stable asymptomatic at current

## 2021-12-19 ENCOUNTER — Other Ambulatory Visit: Payer: 59

## 2021-12-19 ENCOUNTER — Other Ambulatory Visit: Payer: Self-pay | Admitting: Family

## 2021-12-19 DIAGNOSIS — R739 Hyperglycemia, unspecified: Secondary | ICD-10-CM

## 2021-12-19 LAB — COMPREHENSIVE METABOLIC PANEL
ALT: 32 U/L (ref 0–53)
AST: 24 U/L (ref 0–37)
Albumin: 4.4 g/dL (ref 3.5–5.2)
Alkaline Phosphatase: 86 U/L (ref 39–117)
BUN: 20 mg/dL (ref 6–23)
CO2: 29 mEq/L (ref 19–32)
Calcium: 9.9 mg/dL (ref 8.4–10.5)
Chloride: 105 mEq/L (ref 96–112)
Creatinine, Ser: 0.78 mg/dL (ref 0.40–1.50)
GFR: 101.31 mL/min (ref >60.00)
Glucose, Bld: 129 mg/dL — ABNORMAL HIGH (ref 70–99)
Potassium: 3.8 mEq/L (ref 3.5–5.1)
Sodium: 139 mEq/L (ref 135–145)
Total Bilirubin: 0.4 mg/dL (ref 0.2–1.2)
Total Protein: 8.1 g/dL (ref 6.0–8.3)

## 2021-12-19 LAB — LIPID PANEL
Cholesterol: 159 mg/dL (ref 0–200)
HDL: 37.7 mg/dL — ABNORMAL LOW (ref >39.00)
NonHDL: 120.85
Total CHOL/HDL Ratio: 4
Triglycerides: 240 mg/dL — ABNORMAL HIGH (ref 0.0–149.0)
VLDL: 48 mg/dL — ABNORMAL HIGH (ref 0.0–40.0)

## 2021-12-19 LAB — LDL CHOLESTEROL, DIRECT: Direct LDL: 81 mg/dL

## 2021-12-19 LAB — HEMOGLOBIN A1C: Hgb A1c MFr Bld: 6 % (ref 4.6–6.5)

## 2021-12-19 NOTE — Progress Notes (Signed)
Update hemoglobin A1c has resulted as well at 6.0 patient is prediabetic so patient to work on a diabetic diet as well as exercise as tolerated

## 2021-12-19 NOTE — Progress Notes (Signed)
Addressed via hip xray from same day.

## 2021-12-19 NOTE — Progress Notes (Signed)
X-ray of the hip did show severe to moderate osteoarthritis in the hip.  This would make sense for the amount of pain that he is feeling as there is a lot of inflammation.  I have referred him to orthopedics and I think that this will be helpful as they may continue to give him a joint injection and/or consider alternative options.  For now ibuprofen and heat/ice throughout the day would be helpful as well to manage this  The x-ray of the knee does show mild lateral compartment osteoarthritis as well and the orthopedist can address this as well.  The same management is recommended as above

## 2021-12-19 NOTE — Progress Notes (Signed)
Have patient work on a low-cholesterol diet, actually his triglycerides are elevated.  Patient may benefit from over-the-counter omega-3 fish oils daily.  I have ordered a hemoglobin A1c that is pending results as there is slightly elevated glucose to rule out diabetes or prediabetes.  Otherwise lab work is within acceptable range

## 2021-12-27 ENCOUNTER — Other Ambulatory Visit: Payer: Self-pay

## 2021-12-27 ENCOUNTER — Ambulatory Visit (INDEPENDENT_AMBULATORY_CARE_PROVIDER_SITE_OTHER): Payer: 59 | Admitting: Orthopaedic Surgery

## 2021-12-27 DIAGNOSIS — M7062 Trochanteric bursitis, left hip: Secondary | ICD-10-CM | POA: Diagnosis not present

## 2021-12-27 DIAGNOSIS — M1712 Unilateral primary osteoarthritis, left knee: Secondary | ICD-10-CM | POA: Diagnosis not present

## 2021-12-27 MED ORDER — LIDOCAINE HCL 1 % IJ SOLN
4.0000 mL | INTRAMUSCULAR | Status: AC | PRN
  Administered 2021-12-27: 4 mL

## 2021-12-27 MED ORDER — TRIAMCINOLONE ACETONIDE 40 MG/ML IJ SUSP
80.0000 mg | INTRAMUSCULAR | Status: AC | PRN
  Administered 2021-12-27: 80 mg via INTRA_ARTICULAR

## 2021-12-27 NOTE — Progress Notes (Signed)
Chief Complaint: Left knee pain     History of Present Illness:    Jared Foster is a 56 y.o. male with left knee pain and left hip pain ongoing now for 11 months when he had a fall and slipped at work.  At that time he did not feel any audible pops.  Since that time he has had a dull aching pain throughout the entirety of the knee.  He works in [employer].  He does not need to be doing any specific climbing is working mostly on ground level.  He is very active during the day.  He is taking Tylenol which helps somewhat.  He is also complaining of left hip pain about the lateral aspect of the hip which is significantly bothersome to him.    Surgical History:   None  PMH/PSH/Family History/Social History/Meds/Allergies:    Past Medical History:  Diagnosis Date   Appendicitis 2003   Past Surgical History:  Procedure Laterality Date   APPENDECTOMY     COLONOSCOPY WITH PROPOFOL N/A 09/01/2017   Procedure: COLONOSCOPY WITH PROPOFOL;  Surgeon: Jonathon Bellows, MD;  [facility] END[facility];  Service: Gastroenterology;  Laterality: N/A;   Social History   Socioeconomic History   Marital status: Significant Other    Spouse name: Not on file   Number of children: 4   Years of education: Not on file   Highest education level: Not on file  Occupational History   Occupation: electricity  Tobacco Use   Smoking status: Never   Smokeless tobacco: Never  Vaping Use   Vaping Use: Never used  Substance and Sexual Activity   Alcohol use: No   Drug use: No   Sexual activity: Yes    Partners: Male  Other Topics Concern   Not on file  Social History Narrative   Not on file   Social Determinants of Health   Financial Resource Strain: Not on file  Food Insecurity: Not on file  Transportation Needs: Not on file  Physical Activity: Not on file  Stress: Not on file  Social Connections: Not on file   No family history  on file. No Known Allergies No current outpatient medications on file.   No current facility-administered medications for this visit.   No results found.  Review of Systems:   A ROS was performed including pertinent positives and negatives as documented in the HPI.  Physical Exam :   Constitutional: NAD and appears stated age Neurological: Alert and oriented Psych: Appropriate affect and cooperative There were no vitals taken for this visit.   Comprehensive Musculoskeletal Exam:      Musculoskeletal Exam  Gait Normal  Alignment Normal   Right Left  Inspection Normal Normal  Palpation    Tenderness None Tricompartmental  Crepitus None None  Effusion None Trace  Range of Motion    Extension 0 0  Flexion 140 140  Strength    Extension 5/5 5/5  Flexion 5/5 5/5  Ligament Exam     Generalized Laxity No No  Lachman Negative Negative   Pivot Shift Negative Negative  Anterior Drawer Negative Negative  Valgus at 0 Negative Negative  Valgus at 20 Negative Negative  Varus at 0 0 0  Varus at 20   0 0  Posterior Drawer at 90 0 0  Vascular/Lymphatic Exam    Edema None None  Venous Stasis Changes No No  Distal Circulation Normal Normal  Neurologic    Light Touch Sensation Intact Intact  Special Tests: He has pain in the left hip at the greater trochanter with resisted abduction     Imaging:   Xray (4 views left knee, 2 views left hip): There is mild osteoarthritis involving the left knee and left hip    I personally reviewed and interpreted the radiographs.   Assessment:   56 year old male with left knee pain consistent with an osteoarthritis flareup following a fall while at work 11 months prior.  I do believe that what was previously asymptomatic as I become symptomatic tricompartmental osteoarthritis.  As result I have offered him a left knee ultrasound-guided steroid injection.  He would like to proceed with this today.  With regard to the left hip, his exam is  consistent with a greater trochanter syndrome/abductor tendinitis.  As result I have offered him an injection for this as well.  He would like to proceed.  I have described that its not uncommon to have left hip pain that flares up in the setting of a knee injury.  I will plan on giving him a home exercise program to work on strengthening of the hip and the knee for stretching and strengthening.  Plan :    -Ultrasound-guided injection of left greater trochanter and left knee performed today -Return to clinic in 6 weeks for reassessment    Procedure Note  Patient: Jared Foster             Date of Birth: 07/08/1966           MRN: [id number]             Visit Date: 12/27/2021  Procedures: Visit Diagnoses:  1. Trochanteric bursitis, left hip   2. Unilateral primary osteoarthritis, left knee     Large Joint Inj: L knee on 12/27/2021 10:52 AM Indications: pain Details: 22 G 1.5 in needle, ultrasound-guided anterior approach  Arthrogram: No  Medications: 4 mL lidocaine 1 %; 80 mg triamcinolone acetonide 40 MG/ML Outcome: tolerated well, no immediate complications Procedure, treatment alternatives, risks and benefits explained, specific risks discussed. Consent was given by the patient. Immediately prior to procedure a time out was called to verify the correct patient, procedure, equipment, support staff and site/side marked as required. Patient was prepped and draped in the usual sterile fashion.    Large Joint Inj: L greater trochanter on 12/27/2021 10:52 AM Indications: pain Details: 22 G 3.5 in needle, ultrasound-guided anterolateral approach  Arthrogram: No  Medications: 4 mL lidocaine 1 %; 80 mg triamcinolone acetonide 40 MG/ML Outcome: tolerated well, no immediate complications Procedure, treatment alternatives, risks and benefits explained, specific risks discussed. Consent was given by the patient. Immediately prior to procedure a time out was called to  verify the correct patient, procedure, equipment, support staff and site/side marked as required. Patient was prepped and draped in the usual sterile fashion.        I personally saw and evaluated the patient, and participated in the management and treatment plan.  Vanetta Mulders, MD Attending Physician, Orthopedic Surgery  This document was dictated using Dragon voice recognition software. A reasonable attempt at proof reading has been made to minimize errors.

## 2022-01-31 ENCOUNTER — Ambulatory Visit (HOSPITAL_BASED_OUTPATIENT_CLINIC_OR_DEPARTMENT_OTHER): Payer: 59 | Admitting: Orthopaedic Surgery

## 2022-03-28 ENCOUNTER — Ambulatory Visit (INDEPENDENT_AMBULATORY_CARE_PROVIDER_SITE_OTHER): Payer: 59 | Admitting: Orthopaedic Surgery

## 2022-03-28 DIAGNOSIS — M1612 Unilateral primary osteoarthritis, left hip: Secondary | ICD-10-CM | POA: Diagnosis not present

## 2022-03-28 DIAGNOSIS — M1712 Unilateral primary osteoarthritis, left knee: Secondary | ICD-10-CM

## 2022-03-28 NOTE — Progress Notes (Signed)
Chief Complaint: Left hip pain     History of Present Illness:   03/28/2022: Presents today for follow-up predominantly for his left hip.  He had an injection of both the left hip and left knee at the last visit with complete relief in the left knee but with no relief in his left hip.  He is experiencing the pain at today's visit predominantly in his groin.  He has been having this after a fall at work.  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  He has been working on a home exercise program and strengthening for the hip which helps somewhat.  Domingo Pulse [place] Jahid Weida is a 56 y.o. male with left knee pain and left hip pain ongoing now for 11 months when he had a fall and slipped at work.  At that time he did not feel any audible pops.  Since that time he has had a dull aching pain throughout the entirety of the knee.  He works in Lobbyist.  He does not need to be doing any specific climbing is working mostly on ground level.  He is very active during the day.  He is taking Tylenol which helps somewhat.  He is also complaining of left hip pain about the lateral aspect of the hip which is significantly bothersome to him.    Surgical History:   None  PMH/PSH/Family History/Social History/Meds/Allergies:    Past Medical History:  Diagnosis Date   Appendicitis 2003   Past Surgical History:  Procedure Laterality Date   APPENDECTOMY     COLONOSCOPY WITH PROPOFOL N/A 09/01/2017   Procedure: COLONOSCOPY WITH PROPOFOL;  Surgeon: Wyline Mood, MD;  Location: [facility] ENDOSCOPY;  Service: Gastroenterology;  Laterality: N/A;   Social History   Socioeconomic History   Marital status: Significant Other    Spouse name: Not on file   Number of children: 4   Years of education: Not on file   Highest education level: Not on file  Occupational History   Occupation: electricity  Tobacco Use   Smoking status: Never   Smokeless tobacco: Never   Vaping Use   Vaping Use: Never used  Substance and Sexual Activity   Alcohol use: No   Drug use: No   Sexual activity: Yes    Partners: Male  Other Topics Concern   Not on file  Social History Narrative   Not on file   Social Determinants of Health   Financial Resource Strain: Not on file  Food Insecurity: Not on file  Transportation Needs: Not on file  Physical Activity: Not on file  Stress: Not on file  Social Connections: Not on file   No family history on file. No Known Allergies No current outpatient medications on file.   No current facility-administered medications for this visit.   No results found.  Review of Systems:   A ROS was performed including pertinent positives and negatives as documented in the HPI.  Physical Exam :   Constitutional: NAD and appears stated age Neurological: Alert and oriented Psych: Appropriate affect and cooperative There were no vitals taken for this visit.   Comprehensive Musculoskeletal Exam:    Inspection Right Left  Skin No atrophy or gross abnormalities appreciated No atrophy or gross abnormalities appreciated  Palpation    Tenderness None Femoral  acetabular  Crepitus None None  Range of Motion    Flexion (passive) 120 120  Extension 30 30  IR 20 20 with pain  ER 30 30  Strength    Flexion  5/5 5/5  Extension 5/5 5/5  Special Tests    FABER Negative Negative  FADIR Negative Negative  ER Lag/Capsular Insufficiency Negative Negative  Instability Negative Negative  Sacroiliac pain Negative  Negative   Instability    Generalized Laxity No No  Neurologic    sciatic, femoral, obturator nerves intact to light sensation  Vascular/Lymphatic    DP pulse 2+ 2+  Lumbar Exam    Patient has symmetric lumbar range of motion with negative pain referral to hip    Imaging:   Xray (4 views left knee, 2 views left hip): Left hip shows evidence of significant osteoarthritis    I personally reviewed and interpreted the  radiographs.   Assessment:   56 year old male with left hip pain consistent with osteoarthritis.  At the last visit I did perform an ultrasound-guided greater trochanteric injection as at that time the majority of his symptoms appear to be emanating from the lateral hip.  That being said today he is experiencing the majority of his symptoms in the groin.  He did not get any type of significant relief with the lateral based injection and as result I did discuss with him a possible femoral acetabular joint injection.  To that effect he is hoping to defer this as he is ultimately very limited in work and basic activities by the hip and is hoping for a more definitive approach.  He has been doing a home exercise program that I have given him which is helpful but is not completely helping with his pain.  Side effects I further discussed possible hip arthroplasty with him.  He would like to consult with Dr. Magnus Ivan for further discussion of possible left hip arthroplasty  Plan :    -Referral to Dr. Magnus Ivan for discussion of left hip arthroplasty    I personally saw and evaluated the patient, and participated in the management and treatment plan.  Huel Cote, MD Attending Physician, Orthopedic Surgery  This document was dictated using Dragon voice recognition software. A reasonable attempt at proof reading has been made to minimize errors.

## 2022-04-15 ENCOUNTER — Encounter: Payer: Self-pay | Admitting: Orthopaedic Surgery

## 2022-04-15 ENCOUNTER — Other Ambulatory Visit: Payer: Self-pay

## 2022-04-15 ENCOUNTER — Ambulatory Visit (INDEPENDENT_AMBULATORY_CARE_PROVIDER_SITE_OTHER): Payer: 59 | Admitting: Orthopaedic Surgery

## 2022-04-15 VITALS — Ht 70.0 in | Wt 225.0 lb

## 2022-04-15 DIAGNOSIS — M1612 Unilateral primary osteoarthritis, left hip: Secondary | ICD-10-CM | POA: Diagnosis not present

## 2022-04-15 DIAGNOSIS — M7062 Trochanteric bursitis, left hip: Secondary | ICD-10-CM

## 2022-04-15 NOTE — Progress Notes (Signed)
The patient is a very pleasant 56 year old non-English-speaking gentleman who comes with family and interpreter today to discuss his left hip.  He actually sustained a mechanical fall that was work-related back about 8 or 9 months ago.  It may be longer.  Regardless, he has been dealing with left hip and groin pain for some time now.  He saw one of our partners in February who diagnosed him with hip bursitis and knee pain and he had a trochanteric steroid injection over the left hip and a left knee injection.  Last month x-rays were obtained showing significant arthritis of his left hip.  He does have groin pain and his hip does hurt on a daily basis.  It is definitely affecting his mobility, his quality of life and his actives daily living.  Likely had pre-existing arthritis that worsened significantly after this mechanical fall.  He is a diabetic but his last hemoglobin A1c was 6.  He is dealt with prostate issues in the past and that is being evaluated regularly.  On exam his left hip has pain with internal and external rotation and that pain is in the groin.  There is stiffness with rotation as well.  His right hip moves more normally.  I did review the x-rays with him and through the interpreter of his pelvis and left hip.  He does have significant arthritis of the left hip with superior lateral joint space narrowing and bone-on-bone wear.  There is also periarticular osteophytes and sclerotic changes.  His right hip has some mild arthritic changes.  I would like to send him for a one-time intra-articular steroid injection under fluoroscopy of his left hip to help with the diagnosis and treatment of his left hip.  I did give him a handout about hip replacement surgery and discussed this is a possibility given the severity of his arthritis and the pain this is causing him.  I do feel that any type of mechanical fall certainly can worsen arthritic condition.  I explained that this did not cause his  arthritis but his certainly accentuate the pain from arthritis and is probably worsened things for him.  He agrees with this treatment plan.  We will work on setting up for an intra-articular steroid injection of the left hip joint and then I will see him back in 3 to 4 weeks for continued valuation.  I did give him a handout about hip replacement surgery as well.

## 2022-04-21 ENCOUNTER — Telehealth: Payer: Self-pay

## 2022-04-21 NOTE — Telephone Encounter (Signed)
Patient called the office to get scheduled for his left hip injection.

## 2022-05-01 ENCOUNTER — Ambulatory Visit: Payer: Self-pay

## 2022-05-01 ENCOUNTER — Ambulatory Visit (INDEPENDENT_AMBULATORY_CARE_PROVIDER_SITE_OTHER): Payer: 59 | Admitting: Physical Medicine and Rehabilitation

## 2022-05-01 DIAGNOSIS — M25552 Pain in left hip: Secondary | ICD-10-CM | POA: Diagnosis not present

## 2022-05-01 NOTE — Progress Notes (Signed)
   Jared Foster Jared Foster - 56 y.o. male MRN [id number]  Date of birth: 04/16/1966  Office Visit Note: Visit Date: 05/01/2022 PCP: Patient, No Pcp Per Referred by: Kathryne Hitch*  Subjective: No chief complaint on file.  HPI:  Jared Foster Jared Foster is a 56 y.o. male who comes in today at the request of Dr. Doneen Poisson for planned Left anesthetic hip arthrogram with fluoroscopic guidance.  The patient has failed conservative care including home exercise, medications, time and activity modification.  This injection will be diagnostic and hopefully therapeutic.  Please see requesting physician notes for further details and justification.   ROS Otherwise per HPI.  Assessment & Plan: Visit Diagnoses:    ICD-10-CM   1. Pain in left hip  M25.552 XR C-ARM NO REPORT      Plan: No additional findings.   Meds & Orders: No orders of the defined types were placed in this encounter.   Orders Placed This Encounter  Procedures   Large Joint Inj   XR C-ARM NO REPORT    Follow-up: Return for visit to requesting provider as needed.   Procedures: Large Joint Inj: L hip joint on 05/01/2022 8:07 AM Indications: diagnostic evaluation and pain Details: 22 G 3.5 in needle, fluoroscopy-guided anterior approach  Arthrogram: No  Medications: 4 mL bupivacaine 0.25 %; 60 mg triamcinolone acetonide 40 MG/ML Outcome: tolerated well, no immediate complications  There was excellent flow of contrast producing a partial arthrogram of the hip. The patient did have relief of symptoms during the anesthetic phase of the injection. Procedure, treatment alternatives, risks and benefits explained, specific risks discussed. Consent was given by the patient. Immediately prior to procedure a time out was called to verify the correct patient, procedure, equipment, support staff and site/side marked as required. Patient was prepped and draped in the usual sterile fashion.           Clinical History: No specialty comments available.     Objective:  VS:  HT:    WT:   BMI:     BP:   HR: bpm  TEMP: ( )  RESP:  Physical Exam   Imaging: No results found.

## 2022-05-01 NOTE — Progress Notes (Signed)
Pt state left hip pain. Pt state any movement makes the pain worse. Pt state he takes over the counter pain meds to help ease his pain.  Numeric Pain Rating Scale and Functional Assessment Average Pain 10   In the last MONTH (on 0-10 scale) has pain interfered with the following?  1. General activity like being  able to carry out your everyday physical activities such as walking, climbing stairs, carrying groceries, or moving a chair?  Rating(10)   -BT, -Dye Allergies.

## 2022-05-12 ENCOUNTER — Ambulatory Visit (INDEPENDENT_AMBULATORY_CARE_PROVIDER_SITE_OTHER): Payer: 59 | Admitting: Orthopaedic Surgery

## 2022-05-12 ENCOUNTER — Encounter: Payer: Self-pay | Admitting: Orthopaedic Surgery

## 2022-05-12 DIAGNOSIS — M1612 Unilateral primary osteoarthritis, left hip: Secondary | ICD-10-CM

## 2022-05-12 DIAGNOSIS — M25552 Pain in left hip: Secondary | ICD-10-CM

## 2022-05-12 NOTE — Progress Notes (Signed)
The patient comes in today for further follow-up as it relates to his arthritic left hip.  He sustained a work-related injury around 9 months ago or so.  He had pre-existing arthritis in the left hip but it is gotten significantly worse since the injury.  He has tried conservative treatment including rest, anti-inflammatories, time, activity modification and is recently had an intra-articular steroid injection under imaging guidance in his left hip joint.  He says the left hip injection did help somewhat but he still having significant severe pain in the left hip with activities.  At his last visit, we talked about hip replacement surgery and he did get a handout about hip replacement surgery.  I discussed the risk and benefits of the surgery today and talked about what to expect from an intraoperative and postoperative standpoint.  Given the significance of his pain combined with the imaging finding showing significant arthritis in his left hip, we have recommended hip replacement and he does wish to proceed with this surgery given the failure of conservative treatment and his continued pain.  On exam his left hip has stiffness with internal and external rotation as well as significant pain in the groin with rotation.  The right hip moves smoothly normally.  At this point we will work on getting him set up for a left hip replacement.  We will be in touch about scheduling surgery.  All questions and concerns were answered and addressed.

## 2022-05-27 MED ORDER — TRIAMCINOLONE ACETONIDE 40 MG/ML IJ SUSP
60.0000 mg | INTRAMUSCULAR | Status: AC | PRN
  Administered 2022-05-01: 60 mg via INTRA_ARTICULAR

## 2022-05-27 MED ORDER — BUPIVACAINE HCL 0.25 % IJ SOLN
4.0000 mL | INTRAMUSCULAR | Status: AC | PRN
  Administered 2022-05-01: 4 mL via INTRA_ARTICULAR

## 2022-06-17 ENCOUNTER — Other Ambulatory Visit: Payer: 59

## 2022-06-24 ENCOUNTER — Other Ambulatory Visit: Payer: BLUE CROSS/BLUE SHIELD

## 2022-06-24 DIAGNOSIS — R972 Elevated prostate specific antigen [PSA]: Secondary | ICD-10-CM

## 2022-06-25 ENCOUNTER — Telehealth: Payer: Self-pay

## 2022-06-25 LAB — PSA TOTAL (REFLEX TO FREE): Prostate Specific Ag, Serum: 3.7 ng/mL (ref 0.0–4.0)

## 2022-06-25 NOTE — Telephone Encounter (Signed)
-----   Message from Sondra Come, MD sent at 06/25/2022  8:06 AM EDT ----- Randie Heinz news, PSA normal, RTC 1 year PSA reflex to free prior  Legrand Rams, MD 06/25/2022

## 2022-06-25 NOTE — Telephone Encounter (Signed)
Called pt using interpreter services [phone]) informed pt of information below. Pt voiced understanding. Appt scheduled.

## 2022-07-18 ENCOUNTER — Encounter: Payer: Self-pay | Admitting: Emergency Medicine

## 2022-07-18 ENCOUNTER — Emergency Department
Admission: EM | Admit: 2022-07-18 | Discharge: 2022-07-18 | Disposition: A | Discharge status: Home / Self Care | Payer: BLUE CROSS/BLUE SHIELD | Attending: Emergency Medicine | Admitting: Emergency Medicine

## 2022-07-18 ENCOUNTER — Emergency Department: Payer: 59

## 2022-07-18 ENCOUNTER — Other Ambulatory Visit: Payer: Self-pay

## 2022-07-18 DIAGNOSIS — T148XXA Other injury of unspecified body region, initial encounter: Secondary | ICD-10-CM

## 2022-07-18 DIAGNOSIS — Y990 Civilian activity done for income or pay: Secondary | ICD-10-CM | POA: Diagnosis not present

## 2022-07-18 DIAGNOSIS — S3991XA Unspecified injury of abdomen, initial encounter: Secondary | ICD-10-CM | POA: Diagnosis present

## 2022-07-18 DIAGNOSIS — S30811A Abrasion of abdominal wall, initial encounter: Secondary | ICD-10-CM | POA: Diagnosis not present

## 2022-07-18 DIAGNOSIS — W228XXA Striking against or struck by other objects, initial encounter: Secondary | ICD-10-CM | POA: Diagnosis not present

## 2022-07-18 DIAGNOSIS — R0781 Pleurodynia: Secondary | ICD-10-CM | POA: Insufficient documentation

## 2022-07-18 DIAGNOSIS — R7401 Elevation of levels of liver transaminase levels: Secondary | ICD-10-CM | POA: Diagnosis not present

## 2022-07-18 LAB — COMPREHENSIVE METABOLIC PANEL
ALT: 97 U/L — ABNORMAL HIGH (ref 0–44)
AST: 52 U/L — ABNORMAL HIGH (ref 15–41)
Albumin: 4.5 g/dL (ref 3.5–5.0)
Alkaline Phosphatase: 74 U/L (ref 38–126)
Anion gap: 7 (ref 5–15)
BUN: 19 mg/dL (ref 6–20)
CO2: 29 mmol/L (ref 22–32)
Calcium: 10.3 mg/dL (ref 8.9–10.3)
Chloride: 105 mmol/L (ref 98–111)
Creatinine, Ser: 0.91 mg/dL (ref 0.61–1.24)
GFR, Estimated: >60 mL/min (ref >60)
Glucose, Bld: 116 mg/dL — ABNORMAL HIGH (ref 70–99)
Potassium: 4.3 mmol/L (ref 3.5–5.1)
Sodium: 141 mmol/L (ref 135–145)
Total Bilirubin: 0.6 mg/dL (ref 0.3–1.2)
Total Protein: 8.2 g/dL — ABNORMAL HIGH (ref 6.5–8.1)

## 2022-07-18 LAB — CBC WITH DIFFERENTIAL/PLATELET
Abs Immature Granulocytes: 0.09 10*3/uL — ABNORMAL HIGH (ref 0.00–0.07)
Basophils Absolute: 0.1 10*3/uL (ref 0.0–0.1)
Basophils Relative: 1 %
Eosinophils Absolute: 0.1 10*3/uL (ref 0.0–0.5)
Eosinophils Relative: 2 %
HCT: 43.2 % (ref 39.0–52.0)
Hemoglobin: 14.4 g/dL (ref 13.0–17.0)
Immature Granulocytes: 1 %
Lymphocytes Relative: 32 %
Lymphs Abs: 2.3 10*3/uL (ref 0.7–4.0)
MCH: 29.5 pg (ref 26.0–34.0)
MCHC: 33.3 g/dL (ref 30.0–36.0)
MCV: 88.5 fL (ref 80.0–100.0)
Monocytes Absolute: 0.6 10*3/uL (ref 0.1–1.0)
Monocytes Relative: 9 %
Neutro Abs: 4.1 10*3/uL (ref 1.7–7.7)
Neutrophils Relative %: 55 %
Platelets: 221 10*3/uL (ref 150–400)
RBC: 4.88 MIL/uL (ref 4.22–5.81)
RDW: 13 % (ref 11.5–15.5)
WBC: 7.3 10*3/uL (ref 4.0–10.5)
nRBC: 0 % (ref 0.0–0.2)

## 2022-07-18 LAB — CK: Total CK: 206 U/L (ref 49–397)

## 2022-07-18 MED ORDER — OXYCODONE HCL 5 MG PO TABS
5.0000 mg | ORAL_TABLET | Freq: Once | ORAL | Status: AC
  Administered 2022-07-18: 5 mg via ORAL
  Filled 2022-07-18: qty 1

## 2022-07-18 MED ORDER — OXYCODONE HCL 5 MG PO TABS: 5.0000 mg | ORAL_TABLET | Freq: Three times a day (TID) | ORAL | 0 refills | Status: AC | PRN

## 2022-07-18 MED ORDER — IOHEXOL 300 MG/ML  SOLN
100.0000 mL | Freq: Once | INTRAMUSCULAR | Status: AC | PRN
  Administered 2022-07-18: 100 mL via INTRAVENOUS

## 2022-07-18 MED ORDER — IBUPROFEN 600 MG PO TABS: 600.0000 mg | ORAL_TABLET | Freq: Four times a day (QID) | ORAL | 0 refills | Status: AC | PRN

## 2022-07-18 MED ORDER — BACITRACIN ZINC 500 UNIT/GM EX OINT: TOPICAL_OINTMENT | CUTANEOUS | 0 refills | Status: DC
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
## 2022-07-18 NOTE — Discharge Instructions (Signed)
You get some over-the-counter bacitracin ointment or triple antibiotic ointment to keep the area covered and prevent any infections.  Take the ibuprofen to help with pain at this not working and use the oxycodone for breakthrough pain.  Do not drive or work while on the oxycodone.    Take oxycodone as prescribed. Do not drink alcohol, drive or participate in any other potentially dangerous activities while taking this medication as it may make you sleepy. Do not take this medication with any other sedating medications, either prescription or over-the-counter. If you were prescribed Percocet or Vicodin, do not take these with acetaminophen (Tylenol) as it is already contained within these medications.  This medication is an opiate (or narcotic) pain medication and can be habit forming. Use it as little as possible to achieve adequate pain control. Do not use or use it with extreme caution if you have a history of opiate abuse or dependence. If you are on a pain contract with your primary care doctor or a pain specialist, be sure to let them know you were prescribed this medication today from the Emergency Department. This medication is intended for your use only - do not give any to anyone else and keep it in a secure place where nobody else, especially children, have access to it.
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
## 2022-07-25 ENCOUNTER — Ambulatory Visit: Payer: Self-pay | Admitting: Family

## 2022-08-19 ENCOUNTER — Emergency Department
Admission: EM | Admit: 2022-08-19 | Discharge: 2022-08-19 | Disposition: A | Discharge status: Home / Self Care | Payer: Worker's Compensation | Attending: Emergency Medicine | Admitting: Emergency Medicine

## 2022-08-19 ENCOUNTER — Other Ambulatory Visit: Payer: Self-pay

## 2022-08-19 ENCOUNTER — Emergency Department: Payer: Worker's Compensation

## 2022-08-19 DIAGNOSIS — Y990 Civilian activity done for income or pay: Secondary | ICD-10-CM | POA: Insufficient documentation

## 2022-08-19 DIAGNOSIS — M25561 Pain in right knee: Secondary | ICD-10-CM | POA: Insufficient documentation

## 2022-08-19 DIAGNOSIS — X500XXA Overexertion from strenuous movement or load, initial encounter: Secondary | ICD-10-CM | POA: Diagnosis not present

## 2022-08-19 MED ORDER — OXYCODONE-ACETAMINOPHEN 5-325 MG PO TABS
1.0000 | ORAL_TABLET | Freq: Once | ORAL | Status: AC
  Administered 2022-08-19: 1 via ORAL

## 2022-08-19 MED ORDER — OXYCODONE-ACETAMINOPHEN 5-325 MG PO TABS
ORAL_TABLET | ORAL | Status: AC
  Filled 2022-08-19: qty 1

## 2022-08-19 MED ORDER — TRAMADOL HCL 50 MG PO TABS: 50.0000 mg | ORAL_TABLET | Freq: Four times a day (QID) | ORAL | 0 refills | Status: DC | PRN

## 2022-08-19 NOTE — ED Provider Triage Note (Signed)
Emergency Medicine Provider Triage Evaluation Note  Elissa Hefty [place] Dominiq Fontaine, a 56 y.o. male  was evaluated in triage.  Pt complains of right knee pain.  Patient presents to the ED for evaluation of work-related injury where he bumped the medial portion of his right knee against a piece of plastic equipment.  Patient denies any other injury at this time.  Review of Systems  Positive: Left knee pain Negative: FCS  Physical Exam  BP (!) 156/101 (BP Location: Left Arm)   Pulse 85   Temp 98.6 F (37 C) (Oral)   Resp 18   SpO2 99%  Gen:   Awake, no distress  NAD Resp:  Normal effort CTA MSK:   Moves extremities without difficulty right knee without obvious deformity or dislocation Other:    Medical Decision Making  Medically screening exam initiated at 6:41 PM.  Appropriate orders placed.  Elissa Hefty [place] Klayton Monie was informed that the remainder of the evaluation will be completed by another provider, this initial triage assessment does not replace that evaluation, and the importance of remaining in the ED until their evaluation is complete.  Patient to the ED for evaluation of a work-related knee injury.   Melvenia Needles, PA-C 08/19/22 1843

## 2022-08-19 NOTE — Discharge Instructions (Addendum)
Please use your crutches to help take some weight off the right knee when walking.  As we discussed if you are not better in 1 week please call the number provided for orthopedics to arrange a follow-up appointment.  You may take your pain medication as needed but only as prescribed.  Do not drink alcohol or drive while taking this medication.

## 2022-08-19 NOTE — ED Triage Notes (Signed)
Pt to ED via POV from home. Pt ambulatory to triage with limp. Pt reports he hit his knee while working and now c/o right knee pain.   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 Policy # for [facility] [id number].

## 2022-08-19 NOTE — ED Provider Notes (Signed)
   [facility] Provider Note    Event Date/Time   First MD Initiated Contact with Patient 08/19/22 2012     (approximate)  Spanish interpreter used for this evaluation  History   Chief Complaint: Knee Pain (Worker's Comp)  HPI  Elissa Hefty [place] Kinnick Maus is a 56 y.o. male with no significant past medical history who presents to the emergency department for right knee pain.  According to the patient he was at work when a large heavy plastic box struck him in the inside of his right knee.  Patient states significant pain to the knee worse with ambulation but he has been able to ambulate just with pain.  Patient denies any other injuries.  Denies any head injuries.  No other complaints.  Physical Exam   Triage Vital Signs: ED Triage Vitals [08/19/22 1836]  Enc Vitals Group     BP (!) 156/101     Pulse Rate 85     Resp 18     Temp 98.6 F (37 C)     Temp Source Oral     SpO2 99 %     Weight      Height      Head Circumference      Peak Flow      Pain Score 10     Pain Loc      Pain Edu?      Excl. in [place]?     Most recent vital signs: Vitals:   08/19/22 1836  BP: (!) 156/101  Pulse: 85  Resp: 18  Temp: 98.6 F (37 C)  SpO2: 99%    General: Awake, no distress.  CV:  Good peripheral perfusion Resp:  Normal effort.   Other:  Moderate tenderness palpation the medial aspect of the right knee mild to moderate pain with range of motion but good range of motion.   ED Results / Procedures / Treatments   RADIOLOGY  I have reviewed and interpreted the knee x-ray images.  I do not see any obvious fracture or dislocation on my evaluation.   MEDICATIONS ORDERED IN ED: Medications - No data to display   IMPRESSION / MDM / University / ED COURSE  I reviewed the triage vital signs and the nursing notes.  Patient's presentation is most consistent with acute illness / injury with system symptoms.  Patient presents emergency  department for right knee pain after an injury at work.  Overall the patient appears well, no distress.  Does have moderate tenderness palpation of the medial aspect of the right knee and pain with range of motion although he is able to ambulate but with discomfort.  Patient is x-rays negative.  Highly suspect bruise it is possible that the patient has a ligamentous or meniscus injury.  I discussed with the patient short course of pain medication such as tramadol and we will give crutches for weightbearing as tolerated.  I discussed with the patient if his knee is not better in the next 1 week he should call the number provided for orthopedics to arrange a follow-up appointment.  Patient is agreeable to plan of care.  FINAL CLINICAL IMPRESSION(S) / ED DIAGNOSES   Right knee pain  Rx / DC Orders   Tramadol  Note:  This document was prepared using Dragon voice recognition software and may include unintentional dictation errors.   Harvest Dark, MD 08/19/22 2023

## 2022-08-25 ENCOUNTER — Ambulatory Visit (INDEPENDENT_AMBULATORY_CARE_PROVIDER_SITE_OTHER): Payer: BLUE CROSS/BLUE SHIELD | Admitting: Physician Assistant

## 2022-08-25 ENCOUNTER — Encounter: Payer: Self-pay | Admitting: Physician Assistant

## 2022-08-25 DIAGNOSIS — M25561 Pain in right knee: Secondary | ICD-10-CM | POA: Diagnosis not present

## 2022-08-25 MED ORDER — LIDOCAINE HCL 1 % IJ SOLN
4.0000 mL | INTRAMUSCULAR | Status: AC | PRN
  Administered 2022-08-25: 4 mL

## 2022-08-25 MED ORDER — METHYLPREDNISOLONE ACETATE 40 MG/ML IJ SUSP
40.0000 mg | INTRAMUSCULAR | Status: AC | PRN
  Administered 2022-08-25: 40 mg via INTRA_ARTICULAR

## 2022-08-25 NOTE — Progress Notes (Signed)
Office Visit Note   Patient: Jared Foster           Date of Birth: 03-02-1966           MRN: [id number] Visit Date: 08/25/2022              Requested by: Eugenia Pancoast, [place],  [place] [address] PCP: Eugenia Pancoast, FNP   Assessment & Plan: Visit Diagnoses:  1. 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  We will see him back in 2 weeks see what type of response he had to the aspiration injection of the right knee.  He continues to have mechanical symptoms given the hemarthrosis of the knee today would recommend MRI to rule out meniscal tear.  Also discussed with with him that this could represent a bony contusion given the mechanism of injury.  Questions were encouraged and answered today using interpreter.  Recommend use of Aleve 2 tablets twice daily to help control his pain.  Questions were encouraged and answered at length.  Follow-Up Instructions: No follow-ups on file.   Orders:  Orders Placed This Encounter  Procedures   Large Joint Inj   No orders of the defined types were placed in this encounter.     Procedures: Large Joint Inj: R knee on 08/25/2022 10:58 AM Indications: pain Details: 22 G 1.5 in needle, anterolateral approach  Arthrogram: No  Medications: 4 mL lidocaine 1 %; 40 mg methylPREDNISolone acetate 40 MG/ML Aspirate: 7 mL bloody Outcome: tolerated well, no immediate complications Procedure, treatment alternatives, risks and benefits explained, specific risks discussed. Consent was given by the patient. Immediately prior to procedure a time out was called to verify the correct patient, procedure, equipment, support staff and site/side marked as required. Patient was prepped and draped in the usual sterile fashion.       Clinical Data: No additional findings.   Subjective: Chief Complaint  Patient presents with   Right Knee  - Pain    HPI Audelia Hives 56 year old male well-known to Dr. Delilah Shan service comes in today due to new complaint.  Reports that last Tuesday he hit his knee on a plastic box at work since then he has had severe pain in the knee ranks his pain to be 10 out of 10 pain along the medial aspect of the knee.  He notes he is having giving way locking and painful popping in the knee.  Also notes some swelling.  He was seen in the ER on 08/19/2022 and radiographs were obtained.  I personally reviewed the radiographs that showed moderate lateral compartmental arthritic changes.  Medial compartment is well-preserved.  The lateral view is obliqued and difficult to interpret.  No acute fractures are seen. Patient states that he had some mild pain in the knee prior to the injury on Tuesday but since then he has had increased pain.  He was given tramadol by the ER and this really is giving him no relief.  He is also use crutches.  Otherwise no treatment.  Review of Systems  Constitutional:  Negative for chills and fever.     Objective: Vital Signs: There were no vitals taken for this visit.  Physical Exam Constitutional:      Appearance: He is not ill-appearing or diaphoretic.  Pulmonary:     Effort: Pulmonary effort is normal.  Neurological:     Mental Status: He is alert and oriented to person, place, and time.  Psychiatric:        Mood and Affect: Mood normal.     Ortho Exam Right knee full extension full flexion.  Left knee good range of motion without pain.  No instability valgus varus stress in either knee.  Right knee slight effusion no abnormal warmth erythema of either knee.  Tenderness right knee along the medial joint line.  McMurray's positive on the right negative on the left.  Anterior drawer on the right is negative. Specialty Comments:  No specialty comments available.  Imaging: No results found.   PMFS History: Patient Active Problem List   Diagnosis Date Noted   Internal  hemorrhoids 12/18/2021   Encounter for general adult medical examination with abnormal findings 12/18/2021   Left hip pain 12/18/2021   Posterior knee pain, left 12/18/2021   Acute pain of left knee 12/18/2021   Hyperlipidemia 12/18/2021   Elevated PSA 12/18/2021   Enlarged prostate on rectal examination 12/18/2021   Nephrolithiasis 12/18/2021   Past Medical History:  Diagnosis Date   Appendicitis 2003    History reviewed. No pertinent family history.  Past Surgical History:  Procedure Laterality Date   APPENDECTOMY     COLONOSCOPY WITH PROPOFOL N/A 09/01/2017   Procedure: COLONOSCOPY WITH PROPOFOL;  Surgeon: Wyline Mood, MD;  Location: [facility] ENDOSCOPY;  Service: Gastroenterology;  Laterality: N/A;   Social History   Occupational History   Occupation: electricity  Tobacco Use   Smoking status: Never   Smokeless tobacco: Never  Vaping Use   Vaping Use: Never used  Substance and Sexual Activity   Alcohol use: No   Drug use: No   Sexual activity: Yes    Partners: Male

## 2022-09-05 ENCOUNTER — Ambulatory Visit (INDEPENDENT_AMBULATORY_CARE_PROVIDER_SITE_OTHER): Payer: BLUE CROSS/BLUE SHIELD | Admitting: Family

## 2022-09-05 ENCOUNTER — Encounter: Payer: Self-pay | Admitting: Family

## 2022-09-05 ENCOUNTER — Ambulatory Visit
Admission: RE | Admit: 2022-09-05 | Discharge: 2022-09-05 | Disposition: A | Discharge status: Home / Self Care | Payer: BLUE CROSS/BLUE SHIELD | Source: Ambulatory Visit | Attending: Family | Admitting: Family

## 2022-09-05 VITALS — BP 148/88 | HR 94 | Temp 98.6°F | Resp 16 | Ht 70.0 in | Wt 238.5 lb

## 2022-09-05 DIAGNOSIS — R7989 Other specified abnormal findings of blood chemistry: Secondary | ICD-10-CM | POA: Diagnosis present

## 2022-09-05 DIAGNOSIS — R1011 Right upper quadrant pain: Secondary | ICD-10-CM | POA: Insufficient documentation

## 2022-09-05 DIAGNOSIS — R739 Hyperglycemia, unspecified: Secondary | ICD-10-CM | POA: Insufficient documentation

## 2022-09-05 DIAGNOSIS — R779 Abnormality of plasma protein, unspecified: Secondary | ICD-10-CM

## 2022-09-05 DIAGNOSIS — R030 Elevated blood-pressure reading, without diagnosis of hypertension: Secondary | ICD-10-CM | POA: Insufficient documentation

## 2022-09-05 DIAGNOSIS — M25562 Pain in left knee: Secondary | ICD-10-CM

## 2022-09-05 DIAGNOSIS — E785 Hyperlipidemia, unspecified: Secondary | ICD-10-CM

## 2022-09-05 LAB — COMPREHENSIVE METABOLIC PANEL
ALT: 94 U/L — ABNORMAL HIGH (ref 0–53)
AST: 40 U/L — ABNORMAL HIGH (ref 0–37)
Albumin: 4.3 g/dL (ref 3.5–5.2)
Alkaline Phosphatase: 70 U/L (ref 39–117)
BUN: 19 mg/dL (ref 6–23)
CO2: 29 mEq/L (ref 19–32)
Calcium: 9.8 mg/dL (ref 8.4–10.5)
Chloride: 101 mEq/L (ref 96–112)
Creatinine, Ser: 0.83 mg/dL (ref 0.40–1.50)
GFR: 98.93 mL/min (ref >60.00)
Glucose, Bld: 190 mg/dL — ABNORMAL HIGH (ref 70–99)
Potassium: 4.4 mEq/L (ref 3.5–5.1)
Sodium: 138 mEq/L (ref 135–145)
Total Bilirubin: 0.4 mg/dL (ref 0.2–1.2)
Total Protein: 7 g/dL (ref 6.0–8.3)

## 2022-09-05 LAB — HEMOGLOBIN A1C: Hgb A1c MFr Bld: 7 % — ABNORMAL HIGH (ref 4.6–6.5)

## 2022-09-05 NOTE — Assessment & Plan Note (Addendum)
O Work on low cholesterol diet and exercise as tolerated ° °

## 2022-09-05 NOTE — Assessment & Plan Note (Signed)
Ordering hepatitis panel and repeat hepatic function panel pending results. U/s abd ordered to r/o liver enlargement and or liver mass

## 2022-09-05 NOTE — Patient Instructions (Addendum)
I have sent in your order electronically for the following: ultrasound abdomen  at this location below. Please call to schedule the appointment at your convenience  [facility] outpatient imaging center off [address], [place] [address] Phone [phone]-  8-5 pm   Stop by the lab prior to leaving today. I will notify you of your results once received.   Regards,   Eugenia Pancoast FNP-C

## 2022-09-05 NOTE — Assessment & Plan Note (Signed)
Cont f/u as scheduled with orthopedist.

## 2022-09-05 NOTE — Assessment & Plan Note (Signed)
Pt advised of the following:  °Continue medication as prescribed. Monitor blood pressure periodically and/or when you feel symptomatic. Goal is <130/90 on average. Ensure that you have rested for 30 minutes prior to checking your blood pressure. Record your readings and bring them to your next visit if necessary.work on a low sodium diet. ° °

## 2022-09-05 NOTE — Progress Notes (Signed)
Established Patient Office Visit  Subjective:  Patient ID: Jared Foster, male    DOB: November 17, 1965  Age: 56 y.o. MRN: [id number]  CC:  Chief Complaint  Patient presents with   Follow-up    HPI Jared Foster is here today for follow up.  Declines translator as accompanied by daughter in office who speaks english and states will translate for him.   Went to ER at [facility] 08/19/22 for right knee pain. This was due to being struck by large plastic box that hit him inside of right knee.  Knee xray unremarkable, unable to r/o if ligamentous or meniscal injury.  Given short term course of tramadol .  He has since seen orthopedist Dr. Carlis Abbott, seen last 10/23 , gave steroid injection in the knee. They are going to order an MRI possibly to r/o meniscal tear. Pain today per daughter, is minor. Doing well.   9/23 elevated lft. In hospital. Does state he has right lateral side/ruq abdominal pain intermittently. Mainly only painful when he touches the area. It's not constant. Has been on and off over the last one month, however does associate this pain with when he was hit in the stomach/side at work with a tire swing. Ct abd pelvis 9/15 with prostatomegaly (he did see urology per his recollection and was supposedly negative workup) and also small left groin hernia seen.  He denies pain at left inguinal aspect. Denies constipation and or diarrhea.   Lab Results  Component Value Date   PSA1 3.7 06/24/2022   PSA1 4.0 12/12/2021   PSA1 5.1 (H) 10/11/2021      Last metabolic panel Lab Results  Component Value Date   GLUCOSE 116 (H) 07/18/2022   NA 141 07/18/2022   K 4.3 07/18/2022   CL 105 07/18/2022   CO2 29 07/18/2022   BUN 19 07/18/2022   CREATININE 0.91 07/18/2022   GFRNONAA >60 07/18/2022   CALCIUM 10.3 07/18/2022   PROT 8.2 (H) 07/18/2022   ALBUMIN 4.5 07/18/2022   BILITOT 0.6 07/18/2022   ALKPHOS 74 07/18/2022   AST 52 (H) 07/18/2022   ALT 97  (H) 07/18/2022   ANIONGAP 7 07/18/2022   Enlarged prostate seen on Ct scan 07/18/22.   Past Medical History:  Diagnosis Date   Appendicitis 2003    Past Surgical History:  Procedure Laterality Date   APPENDECTOMY     COLONOSCOPY WITH PROPOFOL N/A 09/01/2017   Procedure: COLONOSCOPY WITH PROPOFOL;  Surgeon: Jonathon Bellows, MD;  Location: [facility] ENDOSCOPY;  Service: Gastroenterology;  Laterality: N/A;    No family history on file.  Social History   Socioeconomic History   Marital status: Significant Other    Spouse name: Not on file   Number of children: 4   Years of education: Not on file   Highest education level: Not on file  Occupational History   Occupation: electricity  Tobacco Use   Smoking status: Never   Smokeless tobacco: Never  Vaping Use   Vaping Use: Never used  Substance and Sexual Activity   Alcohol use: No   Drug use: No   Sexual activity: Yes    Partners: Male  Other Topics Concern   Not on file  Social History Narrative   Not on file   Social Determinants of Health   Financial Resource Strain: Not on file  Food Insecurity: Not on file  Transportation Needs: Not on file  Physical Activity: Not on file  Stress: Not on  file  Social Connections: Not on file  Intimate Partner Violence: Not on file    Outpatient Medications Prior to Visit  Medication Sig Dispense Refill   bacitracin ointment Apply to affected area daily (Patient not taking: Reported on 09/05/2022) 425 g 0   traMADol (ULTRAM) 50 MG tablet Take 1 tablet (50 mg total) by mouth every 6 (six) hours as needed. (Patient not taking: Reported on 09/05/2022) 12 tablet 0   No facility-administered medications prior to visit.    No Known Allergies      Objective:    Physical Exam Constitutional:      Appearance: Normal appearance. He is obese.  Cardiovascular:     Rate and Rhythm: Normal rate and regular rhythm.  Pulmonary:     Effort: Pulmonary effort is normal.  Chest:      Comments: No rib tenderness on palpation  Abdominal:     General: Abdomen is flat. Bowel sounds are normal.     Palpations: Abdomen is soft. There is no mass.     Tenderness: There is abdominal tenderness in the right upper quadrant, right lower quadrant and epigastric area. There is no guarding. Negative signs include McBurney's sign.     Hernia: No hernia is present.     Comments: Rlq very slight tenderness  Ruq with 6/10 tenderness on palpation   Neurological:     General: No focal deficit present.     Mental Status: He is alert and oriented to person, place, and time. Mental status is at baseline.  Psychiatric:        Mood and Affect: Mood normal.        Behavior: Behavior normal.        Thought Content: Thought content normal.        Judgment: Judgment normal.      BP (!) 148/88   Pulse 94   Temp 98.6 F (37 C)   Resp 16   Ht 5\' 10"  (1.778 m)   Wt 238 lb 8 oz (108.2 kg)   SpO2 97%   BMI 34.22 kg/m  Wt Readings from Last 3 Encounters:  09/05/22 238 lb 8 oz (108.2 kg)  07/18/22 225 lb 1.4 oz (102.1 kg)  04/15/22 225 lb (102.1 kg)     Health Maintenance Due  Topic Date Due   COVID-19 Vaccine (3 - Pfizer series) 05/15/2020   Zoster Vaccines- Shingrix (2 of 2) 02/12/2022   INFLUENZA VACCINE  06/03/2022   COLONOSCOPY (Pts 45-38yrs Insurance coverage will need to be confirmed)  09/01/2022    There are no preventive care reminders to display for this patient.  No results found for: "TSH" Lab Results  Component Value Date   WBC 7.3 07/18/2022   HGB 14.4 07/18/2022   HCT 43.2 07/18/2022   MCV 88.5 07/18/2022   PLT 221 07/18/2022   Lab Results  Component Value Date   NA 141 07/18/2022   K 4.3 07/18/2022   CO2 29 07/18/2022   GLUCOSE 116 (H) 07/18/2022   BUN 19 07/18/2022   CREATININE 0.91 07/18/2022   BILITOT 0.6 07/18/2022   ALKPHOS 74 07/18/2022   AST 52 (H) 07/18/2022   ALT 97 (H) 07/18/2022   PROT 8.2 (H) 07/18/2022   ALBUMIN 4.5 07/18/2022    CALCIUM 10.3 07/18/2022   ANIONGAP 7 07/18/2022   GFR 101.31 12/18/2021   Lab Results  Component Value Date   CHOL 159 12/18/2021   Lab Results  Component Value Date   HDL 37.70 (L)  12/18/2021   No results found for: "[place]" Lab Results  Component Value Date   TRIG 240.0 (H) 12/18/2021   Lab Results  Component Value Date   CHOLHDL 4 12/18/2021   Lab Results  Component Value Date   HGBA1C 6.0 12/19/2021      Assessment & Plan:   Problem List Items Addressed This Visit       Other   Posterior knee pain, left    Cont f/u as scheduled with orthopedist.       Hyperlipidemia    [place] on low cholesterol diet and exercise as tolerated       Hyperglycemia   Relevant Orders   Comprehensive metabolic panel   Hemoglobin A1c   Right upper quadrant abdominal pain - Primary   Relevant Orders   US ABDOMEN LIMITED RUQ (LIVER/GB)   Elevated liver function tests    Ordering hepatitis panel and repeat hepatic function panel pending results. U/s abd ordered to r/o liver enlargement and or liver mass       Relevant Orders   Comprehensive metabolic panel   Hepatitis panel, acute   US ABDOMEN LIMITED RUQ (LIVER/GB)   Elevated blood pressure reading in office without diagnosis of hypertension    Pt advised of the following:  Continue medication as prescribed. Monitor blood pressure periodically and/or when you feel symptomatic. Goal is <130/90 on average. Ensure that you have rested for 30 minutes prior to checking your blood pressure. Record your readings and bring them to your next visit if necessary.work on a low sodium diet.       Other Visit Diagnoses     Elevated blood protein       Relevant Orders   Comprehensive metabolic panel       No orders of the defined types were placed in this encounter.   Follow-up: Return in about 1 month (around 10/05/2022).    Eugenia Pancoast, FNP

## 2022-09-08 ENCOUNTER — Ambulatory Visit (INDEPENDENT_AMBULATORY_CARE_PROVIDER_SITE_OTHER): Payer: BLUE CROSS/BLUE SHIELD | Admitting: Physician Assistant

## 2022-09-08 ENCOUNTER — Encounter: Payer: Self-pay | Admitting: Physician Assistant

## 2022-09-08 DIAGNOSIS — M25561 Pain in right knee: Secondary | ICD-10-CM

## 2022-09-08 LAB — HEPATITIS PANEL, ACUTE
Hep A IgM: NONREACTIVE
Hep B C IgM: NONREACTIVE
Hepatitis B Surface Ag: NONREACTIVE
Hepatitis C Ab: NONREACTIVE

## 2022-09-08 NOTE — Addendum Note (Signed)
Addended by: Lendon Collar on: 09/08/2022 01:51 PM   Modules accepted: Orders

## 2022-09-08 NOTE — Progress Notes (Signed)
No acute findings or overly concerning issues on ultrasound abdomen. Did see fatty liver, which just means accumulation of fat on the liver, so important to work on diet and exercise.

## 2022-09-08 NOTE — Progress Notes (Signed)
HPI: Jared Foster returns today to go over the results from the right knee injection given on 08/25/2022.  He states he still having pain in the knee.  States the injection helped for about 4 to 5 days but did not take away all of his pain.  He continues to have mechanical symptoms for giving way and painful popping.  Again his pain began after he hit his knee on a plastic box at work.  This is Workmen's Comp. claim at this point time.  He has been out of work since he was last seen.  Has been taking Tylenol for the pain.  States pain medial aspect knee worse with flexion.  Pain is worse with prolonged sitting.  Physical exam: General well-developed well-nourished male in no acute distress walks without any assistive device with a slight antalgic gait on the right. Right knee tenderness along medial joint line.  No instability valgus varus stressing no abnormal warmth erythema or effusion.  Range of motion reveals significant patellofemoral crepitus.  Positive McMurray's.    Impression: Right knee pain acute  Plan: Given patient's failure of conservative treatment and continued knee pain with mechanical symptoms recommend MRI to rule out meniscal tear.  Questions were encouraged and answered at length.

## 2022-09-09 ENCOUNTER — Other Ambulatory Visit: Payer: Self-pay | Admitting: Family

## 2022-09-09 DIAGNOSIS — R7989 Other specified abnormal findings of blood chemistry: Secondary | ICD-10-CM

## 2022-09-09 DIAGNOSIS — E119 Type 2 diabetes mellitus without complications: Secondary | ICD-10-CM

## 2022-09-09 MED ORDER — METFORMIN HCL 500 MG PO TABS: 500.0000 mg | ORAL_TABLET | Freq: Two times a day (BID) | ORAL | 1 refills | Status: DC

## 2022-09-09 NOTE — Progress Notes (Signed)
Noted  

## 2022-09-09 NOTE — Progress Notes (Signed)
Liver function still elevated, no real improvement from last time we checked the labs 1 month ago. I am going to refer pt to GI as well for ongoing elevation of liver function levels.   Diabetes number has increased, pt is now diabetic. I want him to start on metformin 500 mg once daily for one week, then increase to one tablet twice daily after one week and follow up in office in three months. Work on diabetic diet as well.   Negative for hepatitis c.

## 2022-09-16 IMAGING — DX DG HIP (WITH OR WITHOUT PELVIS) 2-3V*L*
3 series · 3 of 3 positions shown · non-contrast
Comparison: CT abdomen/pelvis 05/18/2021.

CLINICAL DATA: Left knee pain radiating to left anterior hip
related to a work injury 1 year ago.

EXAM:
DG HIP (WITH OR WITHOUT PELVIS) 2-3V LEFT

[pelvis ap]
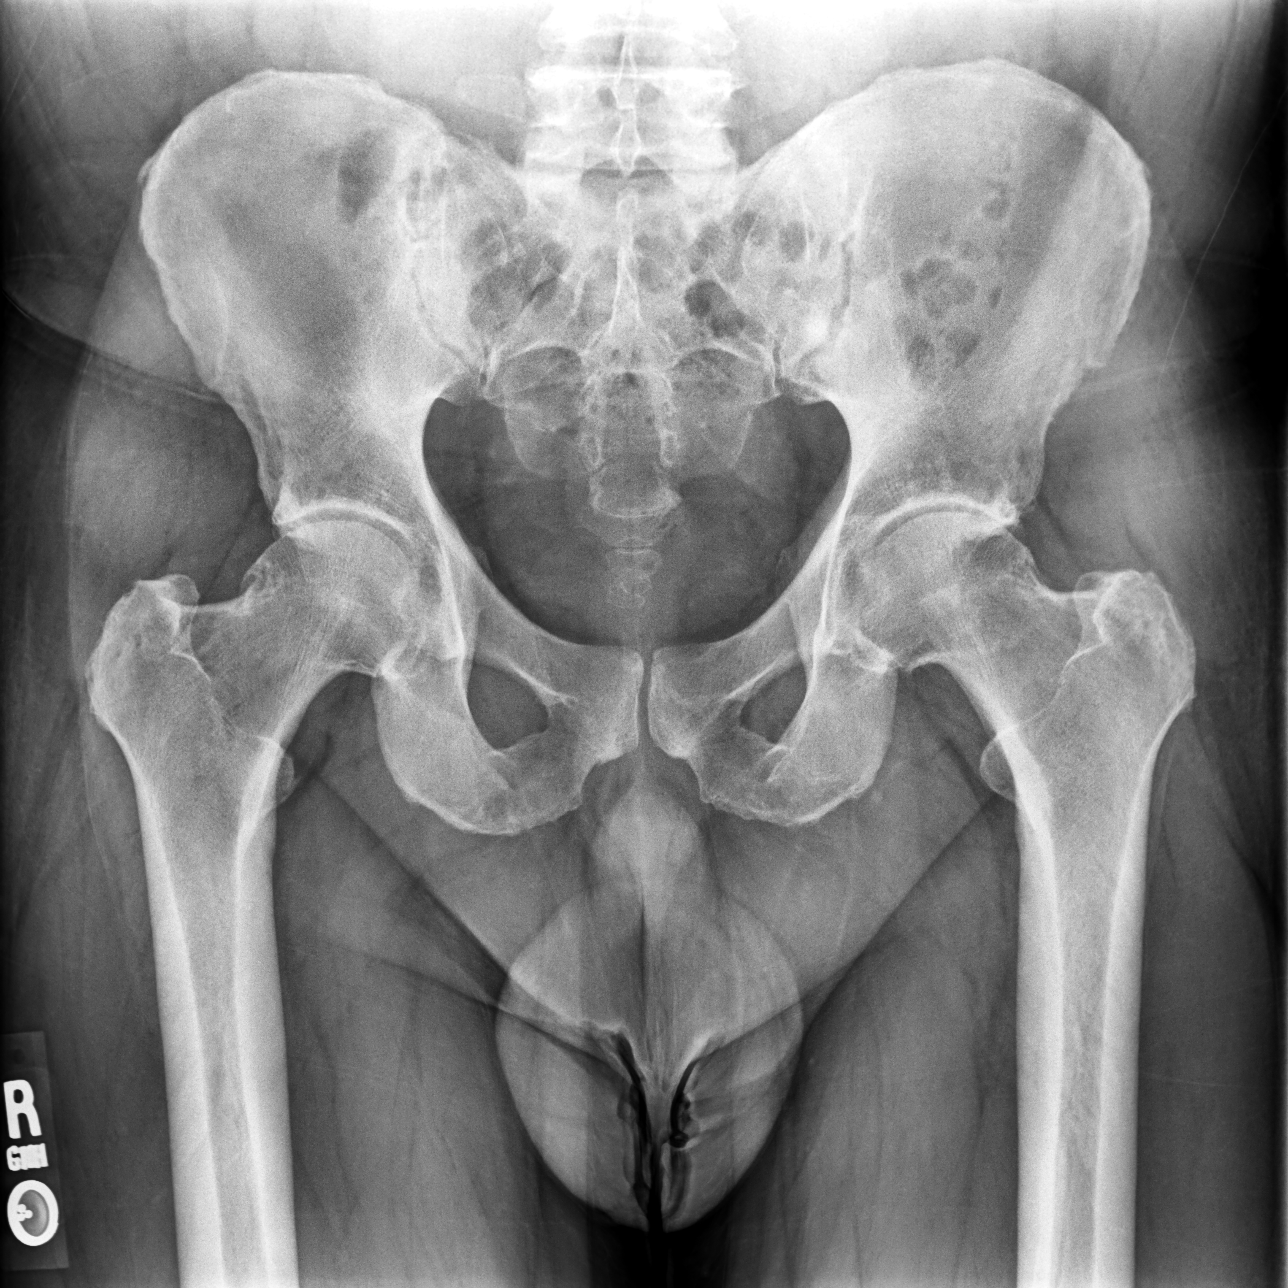

[hip ap]
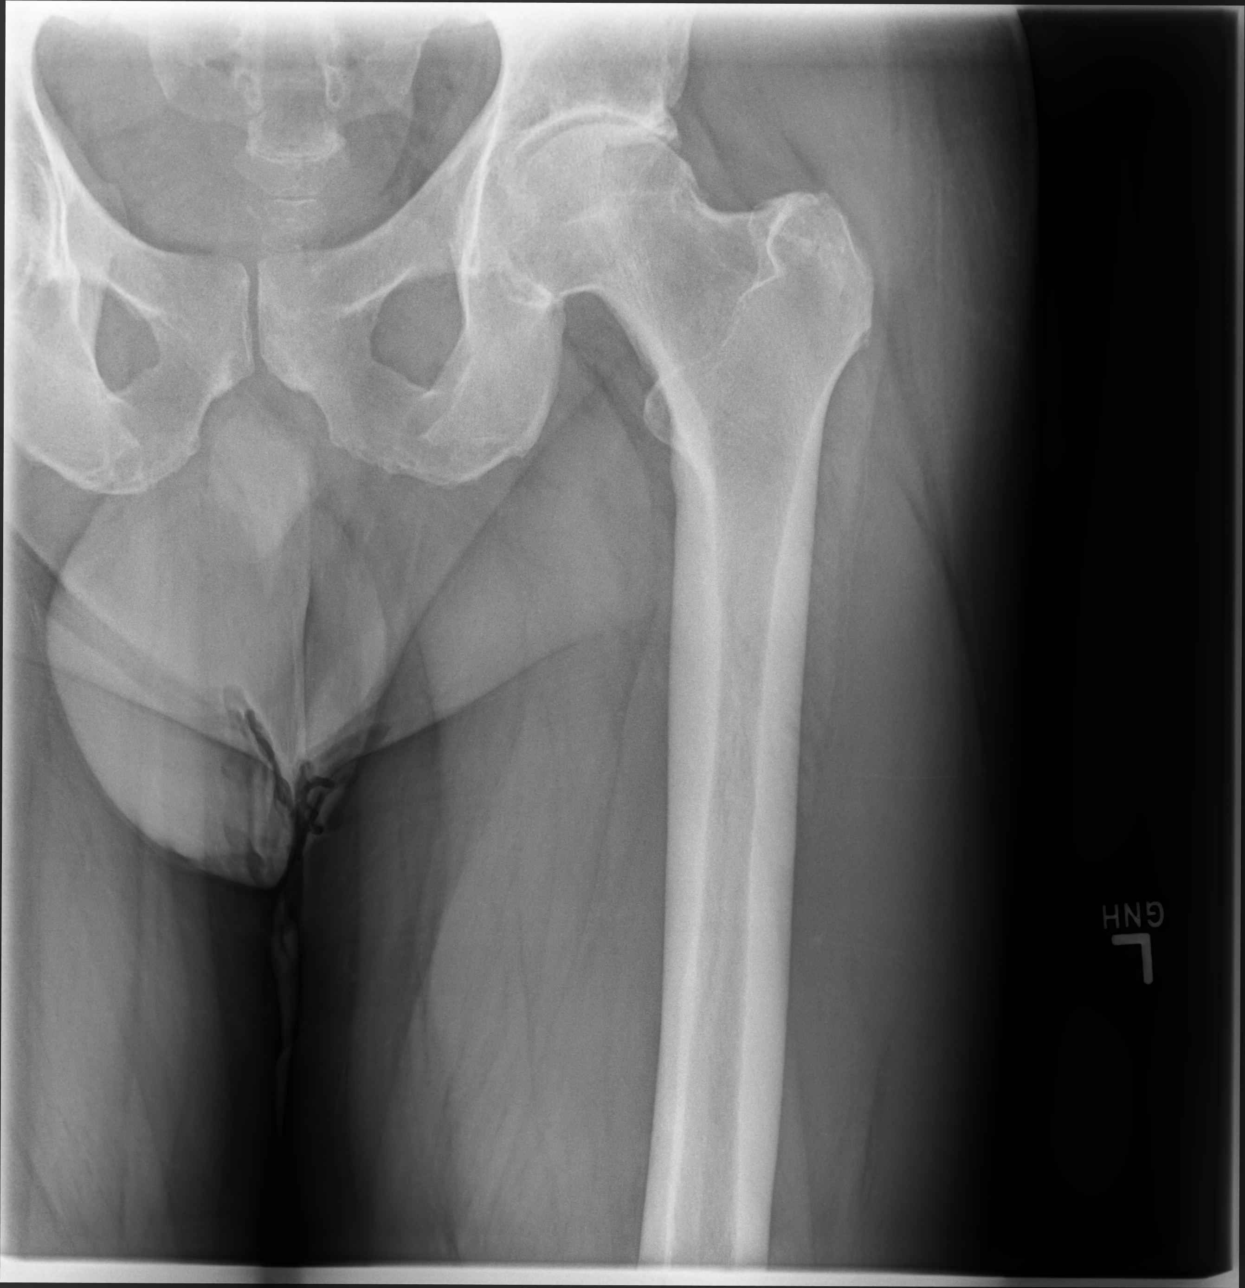

[hip (frog leg)]
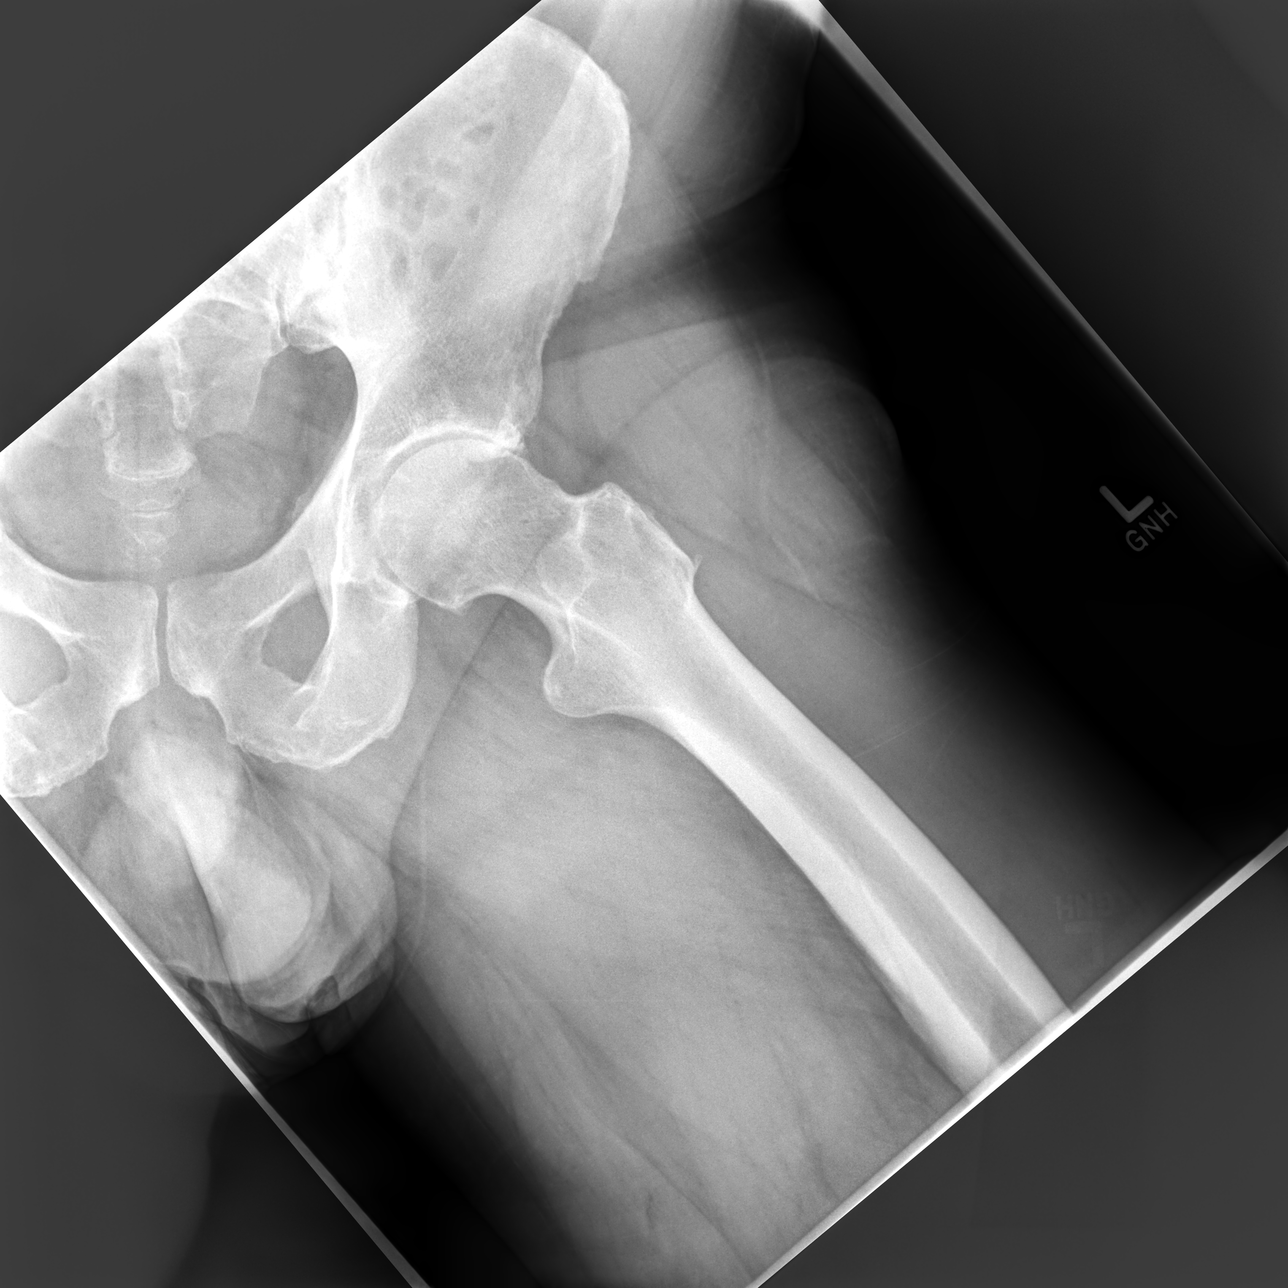

[3 of 3 positions shown; findings below may reference images not displayed]

FINDINGS: Severe superior left moderate superior right femoroacetabular joint
space narrowing. Left superior acetabular greater than right
superior acetabular subchondral sclerosis. Mild to moderate
bilateral femoral head-neck junction degenerative osteophytes.

Mild bilateral sacroiliac joint subchondral sclerosis degenerative
change. As seen on prior CT, there is mild fusion of the mid to
superior aspect of the joints bilaterally.
IMPRESSION: Severe left and moderate right femoroacetabular osteoarthritis.

Multiple regions of fusion of the mid to superior bilateral
sacroiliac joints, better seen on prior CT.

## 2022-09-16 IMAGING — DX DG KNEE COMPLETE 4+V*L*
5 series · 5 of 5 positions shown · non-contrast
Comparison: None.

CLINICAL DATA: Left knee pain radiating to left anterior hip
related to a work injury 1 year ago.

EXAM:
LEFT KNEE - COMPLETE 4+ VIEW

[knee ap]
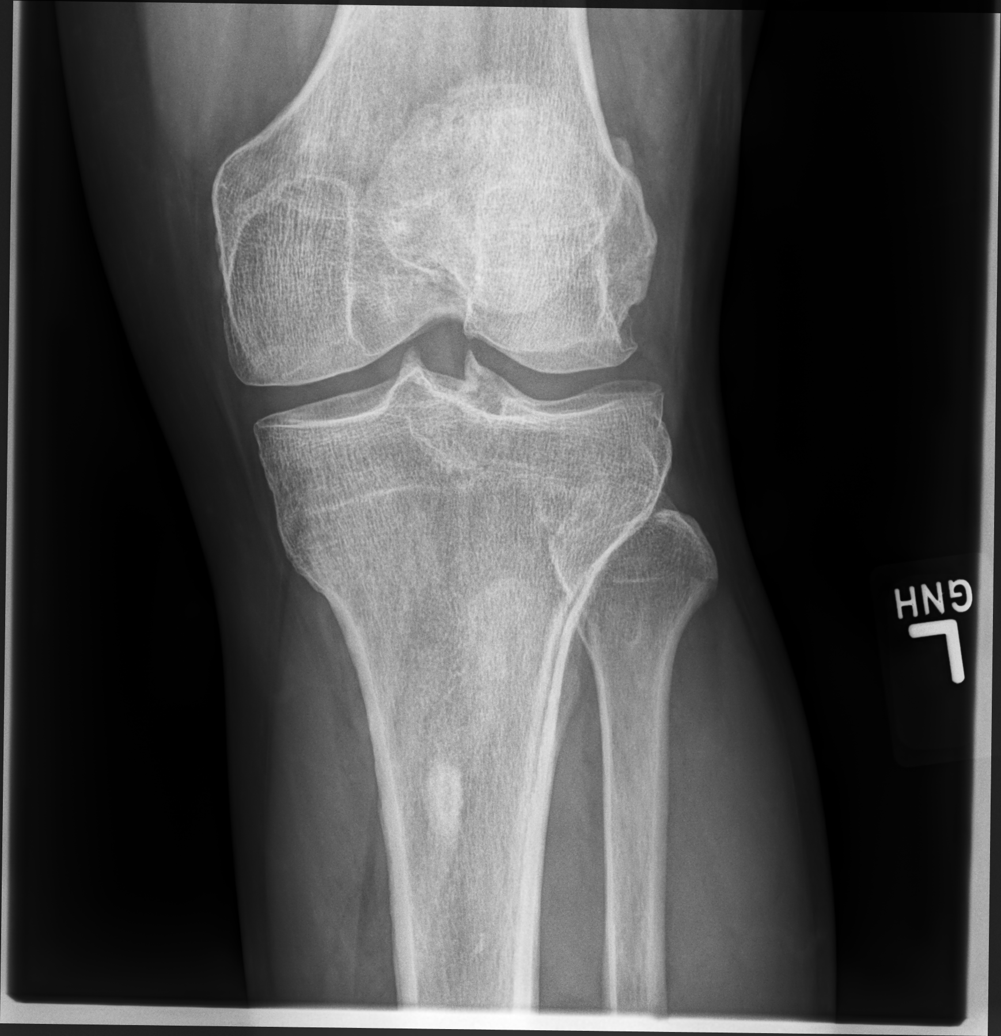

[knee mlo (1 of 2)]
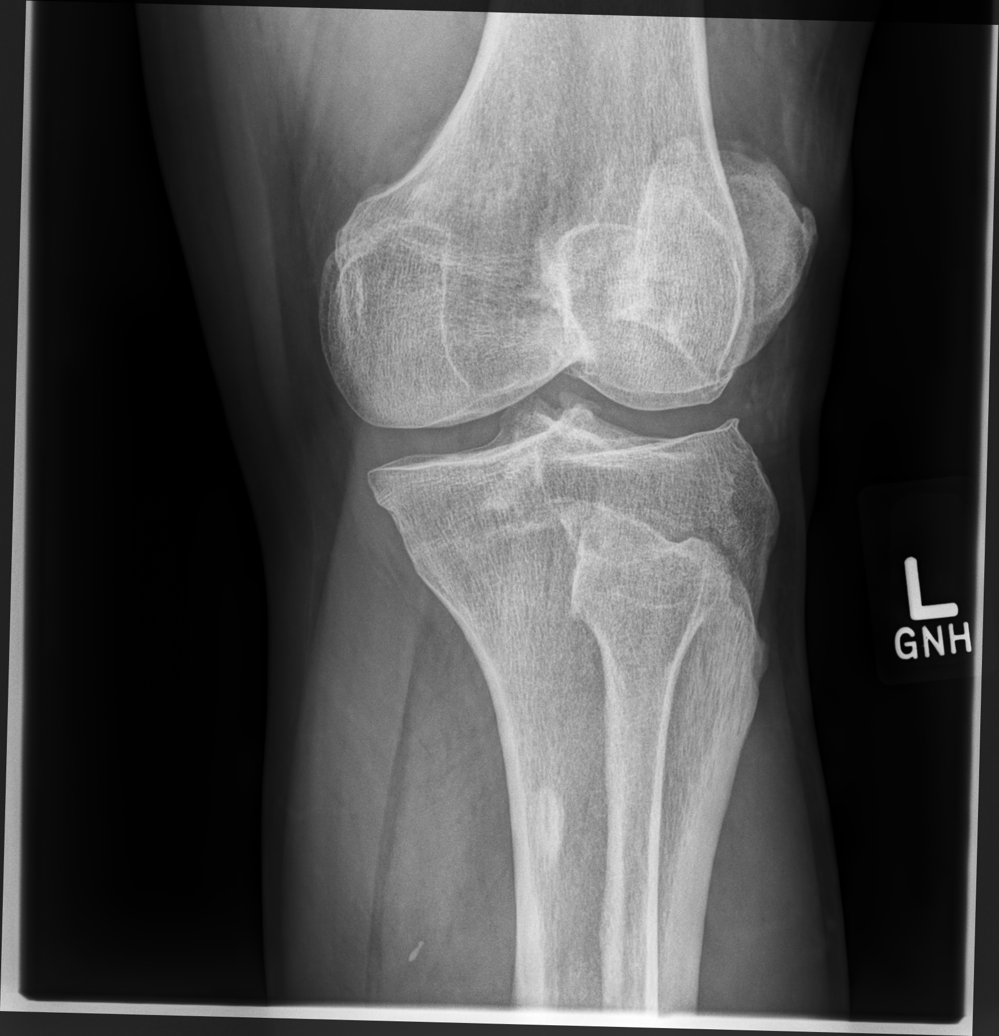

[knee mlo (2 of 2)]
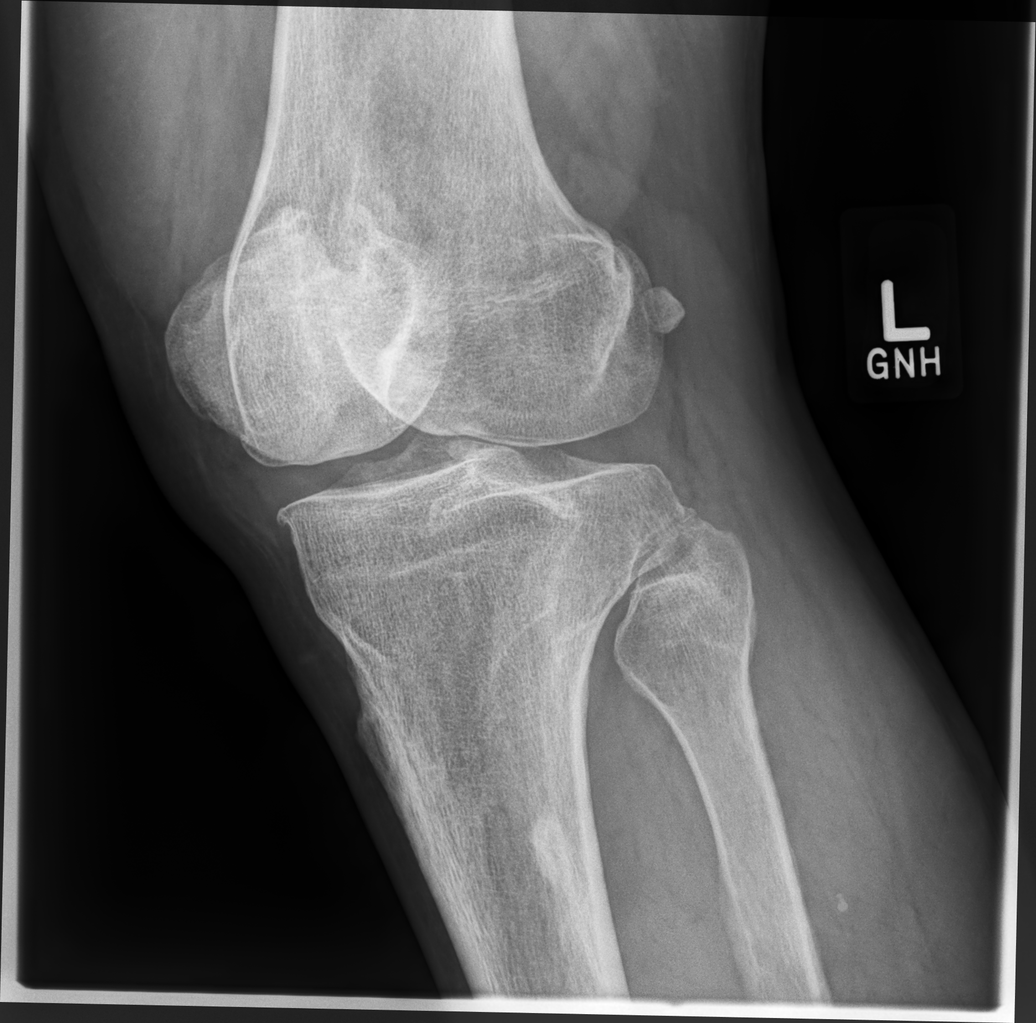

[knee lat]
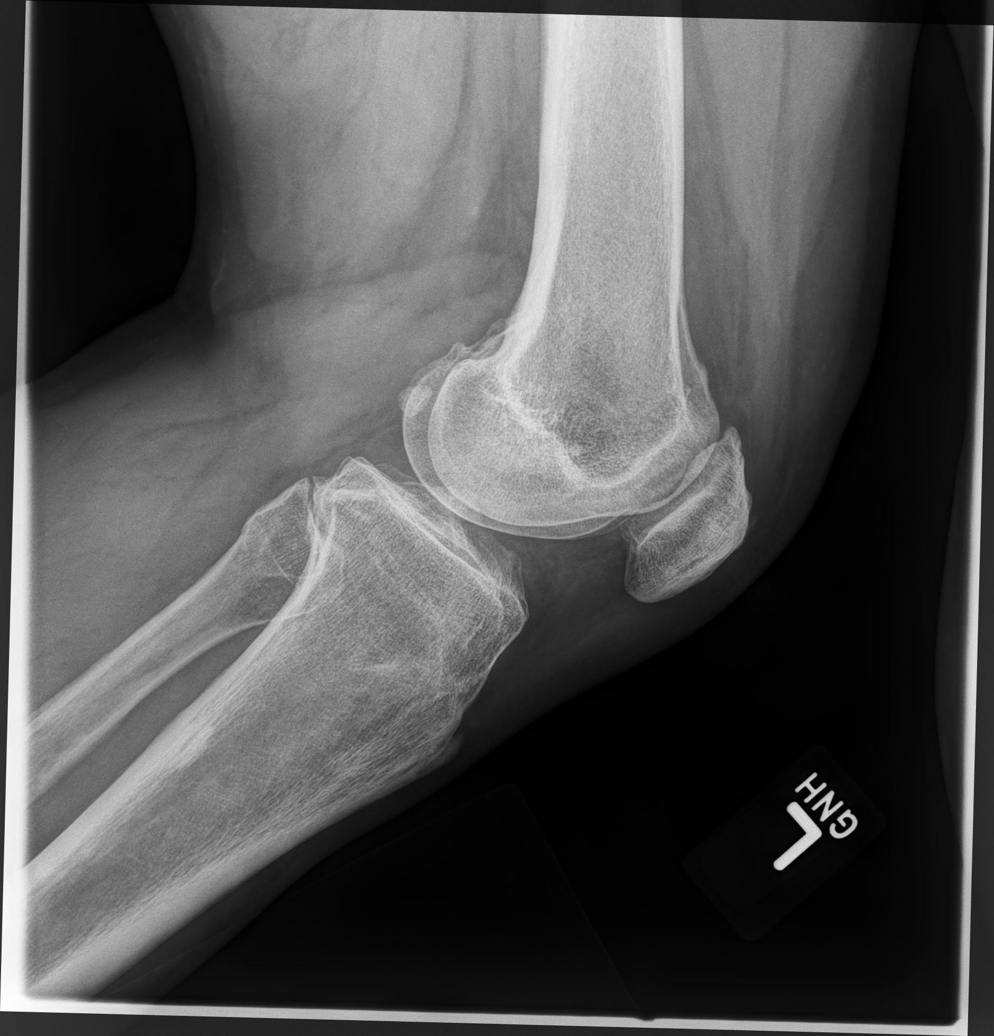

[patella (sunrise) tan]
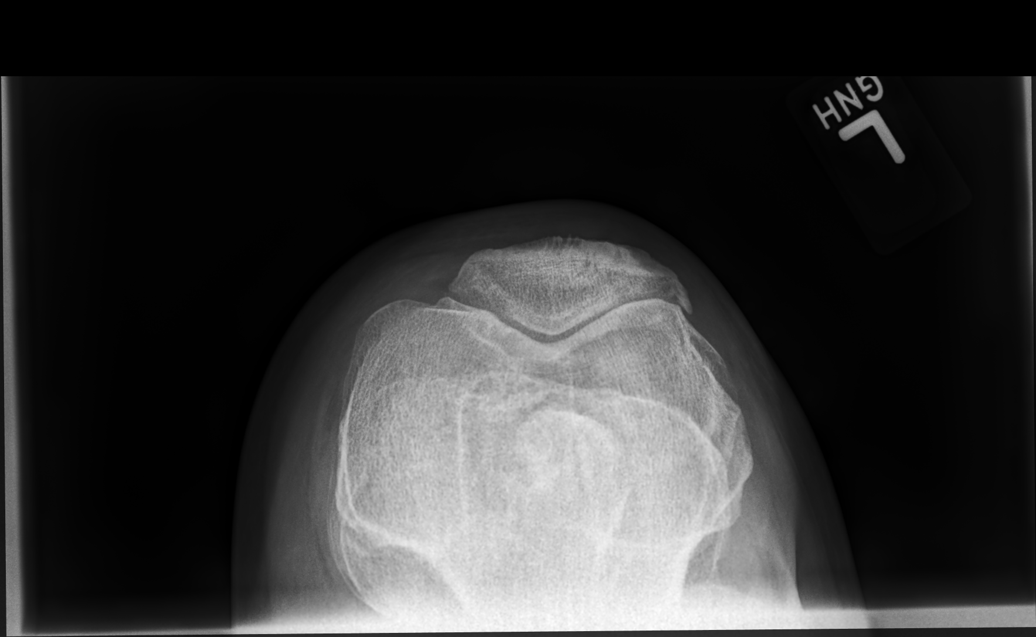

[5 of 5 positions shown; findings below may reference images not displayed]

FINDINGS: Mildly decreased bone mineralization. Mild peripheral lateral
compartment degenerative osteophytosis. Moderate to severe
patellofemoral joint space narrowing and peripheral degenerative
spurring. No joint effusion. Minimal chronic enthesopathic change at
the patellar insertion of the tibial tubercle. There is an oval
sclerotic lesion measuring up to approximately 15 mm in craniocaudal
dimension within the posterior proximal tibial metadiaphysis. This
is nonspecific but is compatible with a benign bone island or a
healed nonossifying fibroma.
IMPRESSION: Moderate to severe patellofemoral and mild lateral compartment
osteoarthritis.

## 2022-10-19 NOTE — Progress Notes (Unsigned)
    Tkai Large T. Jared Gosnell, MD, CAQ Sports Medicine [facility] [address] [place], [address]  Phone: [phone]  FAX: [phone]  Jared Foster Jared Foster - 56 y.o. male  MRN [id number]  Date of Birth: 1966-09-15  Date: 10/20/2022  PCP: Mort Sawyers, FNP  Referral: Mort Sawyers, FNP  No chief complaint on file.  Subjective:   Jared Foster Jared Foster is a 56 y.o. very pleasant male patient with There is no height or weight on file to calculate BMI. who presents with the following:  Patient presents with body aches and fever:     Review of Systems is noted in the HPI, as appropriate  Objective:   There were no vitals taken for this visit.  GEN: No acute distress; alert,appropriate. PULM: Breathing comfortably in no respiratory distress PSYCH: Normally interactive.   Laboratory and Imaging Data:  Assessment and Plan:   ***

## 2022-10-20 ENCOUNTER — Ambulatory Visit (INDEPENDENT_AMBULATORY_CARE_PROVIDER_SITE_OTHER): Payer: BLUE CROSS/BLUE SHIELD | Admitting: Family Medicine

## 2022-10-20 ENCOUNTER — Encounter: Payer: Self-pay | Admitting: Family Medicine

## 2022-10-20 VITALS — BP 140/94 | HR 83 | Temp 99.0°F | Ht 70.0 in | Wt 235.0 lb

## 2022-10-20 DIAGNOSIS — R051 Acute cough: Secondary | ICD-10-CM | POA: Diagnosis not present

## 2022-10-20 DIAGNOSIS — J189 Pneumonia, unspecified organism: Secondary | ICD-10-CM | POA: Diagnosis not present

## 2022-10-20 LAB — POC INFLUENZA A&B (BINAX/QUICKVUE)
Influenza A, POC: NEGATIVE
Influenza B, POC: NEGATIVE

## 2022-10-20 LAB — POC COVID19 BINAXNOW: SARS Coronavirus 2 Ag: NEGATIVE

## 2022-10-20 MED ORDER — DOXYCYCLINE HYCLATE 100 MG PO TABS: 100.0000 mg | ORAL_TABLET | Freq: Two times a day (BID) | ORAL | 0 refills | Status: DC

## 2022-10-20 MED ORDER — IBUPROFEN 800 MG PO TABS: 800.0000 mg | ORAL_TABLET | Freq: Three times a day (TID) | ORAL | 2 refills | Status: DC | PRN

## 2022-12-12 ENCOUNTER — Other Ambulatory Visit: Payer: Self-pay | Admitting: Family

## 2022-12-12 ENCOUNTER — Ambulatory Visit (INDEPENDENT_AMBULATORY_CARE_PROVIDER_SITE_OTHER)
Admission: RE | Admit: 2022-12-12 | Discharge: 2022-12-12 | Disposition: A | Discharge status: Home / Self Care | Payer: Self-pay | Source: Ambulatory Visit | Attending: Family | Admitting: Family

## 2022-12-12 ENCOUNTER — Ambulatory Visit (INDEPENDENT_AMBULATORY_CARE_PROVIDER_SITE_OTHER): Payer: Self-pay | Admitting: Family

## 2022-12-12 VITALS — BP 130/64 | HR 97 | Temp 97.7°F | Ht 70.0 in | Wt 239.0 lb

## 2022-12-12 DIAGNOSIS — R12 Heartburn: Secondary | ICD-10-CM | POA: Insufficient documentation

## 2022-12-12 DIAGNOSIS — R7989 Other specified abnormal findings of blood chemistry: Secondary | ICD-10-CM

## 2022-12-12 DIAGNOSIS — E6609 Other obesity due to excess calories: Secondary | ICD-10-CM

## 2022-12-12 DIAGNOSIS — R0609 Other forms of dyspnea: Secondary | ICD-10-CM

## 2022-12-12 DIAGNOSIS — E119 Type 2 diabetes mellitus without complications: Secondary | ICD-10-CM | POA: Insufficient documentation

## 2022-12-12 DIAGNOSIS — R0602 Shortness of breath: Secondary | ICD-10-CM

## 2022-12-12 DIAGNOSIS — R7303 Prediabetes: Secondary | ICD-10-CM

## 2022-12-12 DIAGNOSIS — Z8701 Personal history of pneumonia (recurrent): Secondary | ICD-10-CM

## 2022-12-12 DIAGNOSIS — Z6830 Body mass index (BMI) 30.0-30.9, adult: Secondary | ICD-10-CM

## 2022-12-12 LAB — CBC WITH DIFFERENTIAL/PLATELET
Basophils Absolute: 0.1 10*3/uL (ref 0.0–0.1)
Basophils Relative: 0.9 % (ref 0.0–3.0)
Eosinophils Absolute: 0.3 10*3/uL (ref 0.0–0.7)
Eosinophils Relative: 4 % (ref 0.0–5.0)
HCT: 45.1 % (ref 39.0–52.0)
Hemoglobin: 15.2 g/dL (ref 13.0–17.0)
Lymphocytes Relative: 36 % (ref 12.0–46.0)
Lymphs Abs: 2.6 10*3/uL (ref 0.7–4.0)
MCHC: 33.6 g/dL (ref 30.0–36.0)
MCV: 88.9 fl (ref 78.0–100.0)
Monocytes Absolute: 0.7 10*3/uL (ref 0.1–1.0)
Monocytes Relative: 10.1 % (ref 3.0–12.0)
Neutro Abs: 3.5 10*3/uL (ref 1.4–7.7)
Neutrophils Relative %: 49 % (ref 43.0–77.0)
Platelets: 237 10*3/uL (ref 150.0–400.0)
RBC: 5.08 Mil/uL (ref 4.22–5.81)
RDW: 13.5 % (ref 11.5–15.5)
WBC: 7.1 10*3/uL (ref 4.0–10.5)

## 2022-12-12 LAB — HEPATIC FUNCTION PANEL
ALT: 88 U/L — ABNORMAL HIGH (ref 0–53)
AST: 36 U/L (ref 0–37)
Albumin: 4.6 g/dL (ref 3.5–5.2)
Alkaline Phosphatase: 90 U/L (ref 39–117)
Bilirubin, Direct: 0.1 mg/dL (ref 0.0–0.3)
Total Bilirubin: 0.4 mg/dL (ref 0.2–1.2)
Total Protein: 7.3 g/dL (ref 6.0–8.3)

## 2022-12-12 LAB — POCT GLYCOSYLATED HEMOGLOBIN (HGB A1C): Hemoglobin A1C: 5.9 % — AB (ref 4.0–5.6)

## 2022-12-12 MED ORDER — OMEPRAZOLE 20 MG PO CPDR: 20.0000 mg | DELAYED_RELEASE_CAPSULE | Freq: Every day | ORAL | 0 refills | Status: DC

## 2022-12-12 MED ORDER — PREDNISONE 20 MG PO TABS: ORAL_TABLET | ORAL | 0 refills | Status: DC

## 2022-12-12 MED ORDER — METFORMIN HCL 500 MG PO TABS: 500.0000 mg | ORAL_TABLET | Freq: Every day | ORAL | 1 refills | Status: DC

## 2022-12-12 NOTE — Progress Notes (Unsigned)
Established Patient Office Visit  Subjective:   Patient ID: Jared Foster, male    DOB: Feb 09, 1966  Age: 57 y.o. MRN: [id number]  CC:  Chief Complaint  Patient presents with   Diabetes   Cough    No improvement productive at times.     HPI: Jared Foster [place] Amair Quist is a 57 y.o. male presenting today for follow-up.  DM2: currently taking Metformin 574m twice daily. States the medicine is causing him to eat more. Denies abdominal pain, nausea/vomiting, diarrhea, constipation. Has made some changes in his diet. His no longer drinking sodas, reduced his intake of meats and fried foods. Reports that he has been walking at least 2 times a week in the mornings.  Lab Results  Component Value Date   HGBA1C 5.9 (A) 12/12/2022    Cough: continues to have cough after treatment for PNA. States cough is productive with white sputum. Is not taking anything for cough. States he has daily wheezing and shortness of breath with exertion.   Elevated liver enzymes:  [place]RUQ abdominal on 09/05/2022 showed fatty liver. Referral to GI was made and he has an appointment in March. Lab Results  Component Value Date   ALT 94 (H) 09/05/2022   AST 40 (H) 09/05/2022   ALKPHOS 70 09/05/2022   BILITOT 0.4 09/05/2022    Heart burn: reports that he is experiencing some heartburn daily. He is not taking anything to relieve heartburn. Denies eating spicy food and states that he has decreased the amount of fried foods.     ROS: Negative unless specifically indicated above in HPI.   Relevant past medical history reviewed and updated as indicated.   Allergies and medications reviewed and updated.   Current Outpatient Medications:    ibuprofen (ADVIL) 800 MG tablet, Take 1 tablet (800 mg total) by mouth every 8 (eight) hours as needed., Disp: 60 tablet, Rfl: 2   omeprazole (PRILOSEC) 20 MG capsule, Take 1 capsule (20 mg total) by mouth daily., Disp: 30 capsule, Rfl: 0   metFORMIN  (GLUCOPHAGE) 500 MG tablet, Take 1 tablet (500 mg total) by mouth daily with breakfast., Disp: 90 tablet, Rfl: 1  No Known Allergies  Objective:   BP 130/64   Pulse 97   Temp 97.7 F (36.5 C) (Temporal)   Ht 5' 10"$  (1.778 m)   Wt 239 lb (108.4 kg)   SpO2 99%   BMI 34.29 kg/m    Physical Exam Vitals reviewed.  Constitutional:      Appearance: Normal appearance. He is obese.  HENT:     Head: Normocephalic.  Cardiovascular:     Rate and Rhythm: Normal rate and regular rhythm.     Pulses: Normal pulses.     Heart sounds: Normal heart sounds.  Pulmonary:     Effort: Pulmonary effort is normal.     Breath sounds: Normal breath sounds.  Abdominal:     General: Bowel sounds are normal.     Palpations: Abdomen is soft.     Tenderness: There is abdominal tenderness in the right upper quadrant.  Musculoskeletal:     Right lower leg: No edema.     Left lower leg: No edema.  Skin:    General: Skin is dry.     Capillary Refill: Capillary refill takes less than 2 seconds.  Neurological:     General: No focal deficit present.     Mental Status: He is alert and oriented to person, place, and time.  Psychiatric:        Mood and Affect: Mood normal.        Behavior: Behavior normal. Behavior is cooperative.        Thought Content: Thought content normal.        Judgment: Judgment normal.     Assessment & Plan:  Prediabetes -     POCT glycosylated hemoglobin (Hb A1C)  DOE (dyspnea on exertion) Assessment & Plan: CXR ordered and results are pending.   Orders: -     DG Chest 2 View; Future  History of pneumonia Assessment & Plan: Due to continue cough, wheezing, and dyspnea on exertion, CXR ordered, pending results. CBC also ordered and pending results.  Orders: -     DG Chest 2 View; Future -     CBC with Differential/Platelet  Elevated liver function tests Assessment & Plan: Patient scheduled to see GI in March. Keep scheduled appointment.  Hepatic function panel  ordered and pending results.   Orders: -     Hepatic function panel  Heartburn Assessment & Plan: Rx sent to patient's pharmacy for Prilosec 17m daily. Advised patient to avoid triggering foods such as fried foods, spicy foods, chocolate, caffeine, etc.   Orders: -     Omeprazole; Take 1 capsule (20 mg total) by mouth daily.  Dispense: 30 capsule; Refill: 0  Controlled type 2 diabetes mellitus without complication, without long-term current use of insulin (HCC) Assessment & Plan: HgbA1C has decreased from 7 to 5.9. Will decrease Metformin to 5043mdaily due to patient concerns for side effects. Continue to work on diabetic diet. Microalbumin ordered and pending results.   Orders: -     metFORMIN HCl; Take 1 tablet (500 mg total) by mouth daily with breakfast.  Dispense: 90 tablet; Refill: 1 -     Microalbumin / creatinine urine ratio; Future  Class 1 obesity due to excess calories with body mass index (BMI) of 30.0 to 30.9 in adult, unspecified whether serious comorbidity present Assessment & Plan: Commended patient on changes to diet and the addition of exercise. Educated patient on the importance of continuing positive lifestyle changes.      Follow up plan: Return in about 3 months (around 03/12/2023).  TaJohny ShockRN AGNP- student

## 2022-12-12 NOTE — Progress Notes (Signed)
Liver function stable however still elevated.  Advise pt to keep appointment as scheduled for GI for f/u.

## 2022-12-12 NOTE — Assessment & Plan Note (Signed)
Rx sent to patient's pharmacy for Prilosec 75m daily. Advised patient to avoid triggering foods such as fried foods, spicy foods, chocolate, caffeine, etc.

## 2022-12-12 NOTE — Progress Notes (Signed)
Good news.  No acute findings on chest xray. No suggestion of COPD.  Please let pt know I am sending in prednisone to his pharmacy to see if maybe we can improve his shortness of breath. (Pt doesn't speak english)

## 2022-12-12 NOTE — Progress Notes (Signed)
Established Patient Office Visit  Subjective:      CC:  Chief Complaint  Patient presents with  . Diabetes  . Cough    No improvement productive at times.     HPI: Jared Foster is a 57 y.o. male presenting on 12/12/2022 for Diabetes and Cough (No improvement productive at times. ) . Elevated LFT:s when seen in 11/23 was referred to GI. Negative for hepatitis C. U/s abdomen negative except for fatty liver. Has appt March 25th for GI consult with Dr. Marius Ditch.   Diabetes type 2: started metformin last visit and A1c has come down from 7 now at 5.9 .has started walking a few times a week and trying to work on his diet. He is worried because metformin makes him want to eat more, he is taking twice daily.  Lab Results  Component Value Date   HGBA1C 5.9 (A) 12/12/2022   Does have increased heartburn, does   New complaints: Still with cough ongoing, treated for pneumonia back in December wstill with whitish sputum with occasional wheezing. Pneumonia was treated with doxycycline. Doe upon walking. No swelling in ankles.      Social history:  Relevant past medical, surgical, family and social history reviewed and updated as indicated. Interim medical history since our last visit reviewed.  Allergies and medications reviewed and updated.  DATA REVIEWED: CHART IN EPIC     ROS: Negative unless specifically indicated above in HPI.    Current Outpatient Medications:  .  ibuprofen (ADVIL) 800 MG tablet, Take 1 tablet (800 mg total) by mouth every 8 (eight) hours as needed., Disp: 60 tablet, Rfl: 2 .  omeprazole (PRILOSEC) 20 MG capsule, Take 1 capsule (20 mg total) by mouth daily., Disp: 30 capsule, Rfl: 0 .  metFORMIN (GLUCOPHAGE) 500 MG tablet, Take 1 tablet (500 mg total) by mouth daily with breakfast., Disp: 90 tablet, Rfl: 1      Objective:    BP 130/64   Pulse 97   Temp 97.7 F (36.5 C) (Temporal)   Ht 5' 10"$  (1.778 m)   Wt 239 lb (108.4 kg)    SpO2 99%   BMI 34.29 kg/m   Wt Readings from Last 3 Encounters:  12/12/22 239 lb (108.4 kg)  10/20/22 235 lb (106.6 kg)  09/05/22 238 lb 8 oz (108.2 kg)    Physical Exam Constitutional:      General: He is not in acute distress.    Appearance: Normal appearance. He is obese. He is not ill-appearing, toxic-appearing or diaphoretic.  Cardiovascular:     Rate and Rhythm: Normal rate and regular rhythm.  Pulmonary:     Effort: Pulmonary effort is normal.     Breath sounds: Normal breath sounds. No wheezing.  Abdominal:     Tenderness: There is abdominal tenderness in the right upper quadrant.  Musculoskeletal:        General: Normal range of motion.  Neurological:     General: No focal deficit present.     Mental Status: He is alert and oriented to person, place, and time. Mental status is at baseline.  Psychiatric:        Mood and Affect: Mood normal.        Behavior: Behavior normal.        Thought Content: Thought content normal.        Judgment: Judgment normal.    .        Assessment & Plan:  Prediabetes -  POCT glycosylated hemoglobin (Hb A1C)  DOE (dyspnea on exertion) -     DG Chest 2 View; Future  History of pneumonia -     DG Chest 2 View; Future -     CBC with Differential/Platelet  Elevated liver function tests -     Hepatic function panel  Heartburn -     Omeprazole; Take 1 capsule (20 mg total) by mouth daily.  Dispense: 30 capsule; Refill: 0  Controlled type 2 diabetes mellitus without complication, without long-term current use of insulin (HCC) -     metFORMIN HCl; Take 1 tablet (500 mg total) by mouth daily with breakfast.  Dispense: 90 tablet; Refill: 1 -     Microalbumin / creatinine urine ratio     No follow-ups on file.  Eugenia Pancoast, MSN, APRN, FNP-C [place]

## 2022-12-12 NOTE — Assessment & Plan Note (Signed)
Patient scheduled to see GI in March. Keep scheduled appointment.  Hepatic function panel ordered and pending results.

## 2022-12-12 NOTE — Assessment & Plan Note (Addendum)
Due to continue cough, wheezing, and dyspnea on exertion, CXR ordered, pending results. CBC also ordered and pending results.

## 2022-12-12 NOTE — Assessment & Plan Note (Signed)
CXR ordered and results are pending.

## 2022-12-12 NOTE — Assessment & Plan Note (Signed)
Commended patient on changes to diet and the addition of exercise. Educated patient on the importance of continuing positive lifestyle changes.   I evaluated the patient,  was consulted regarding plans for treatment of care, and agree with the assessment and plan per Joya Gaskins, RN, DNP student.  -Eugenia Pancoast, FNP-C

## 2022-12-12 NOTE — Progress Notes (Signed)
D/w pt in office, improved. Will decrease metformin to 500 mg once daily.

## 2022-12-12 NOTE — Assessment & Plan Note (Addendum)
HgbA1C has decreased from 7 to 5.9. Will decrease Metformin to 578m daily due to patient concerns for side effects. Continue to work on diabetic diet. Microalbumin ordered and pending results.

## 2023-01-26 ENCOUNTER — Encounter: Payer: Self-pay | Admitting: Gastroenterology

## 2023-01-26 ENCOUNTER — Ambulatory Visit: Payer: Self-pay | Admitting: Gastroenterology

## 2023-03-20 ENCOUNTER — Ambulatory Visit (INDEPENDENT_AMBULATORY_CARE_PROVIDER_SITE_OTHER): Payer: 59 | Admitting: Physician Assistant

## 2023-03-20 ENCOUNTER — Encounter: Payer: Self-pay | Admitting: Physician Assistant

## 2023-03-20 VITALS — BP 132/88 | HR 82 | Temp 98.2°F | Ht 70.0 in | Wt 230.6 lb

## 2023-03-20 DIAGNOSIS — R7989 Other specified abnormal findings of blood chemistry: Secondary | ICD-10-CM | POA: Diagnosis not present

## 2023-03-20 DIAGNOSIS — K760 Fatty (change of) liver, not elsewhere classified: Secondary | ICD-10-CM

## 2023-03-20 NOTE — Patient Instructions (Signed)
[facility] [address], [place], [place] [address]  17 mi [phone]    Dieta para la enfermedad heptica por hgado graso no alcohlico en los adultos Nonalcoholic Fatty Liver Disease Diet, Adult La enfermedad heptica por hgado graso no alcohlico es una afeccin que hace que se acumule grasa dentro y alrededor del hgado. La enfermedad dificulta el funcionamiento adecuado del hgado. Llevar una dieta saludable compuesta de frutas, verduras, cereales integrales, protenas magras y limitar el consumo de azcar agregada y de grasas pueden contribuir a Pharmacologist bajo control la enfermedad heptica por hgado graso no alcohlico. Tambin puede ayudar a prevenir o mejorar afecciones relacionadas con la enfermedad, como las enfermedades cardacas, la diabetes, la hipertensin arterial, la obesidad y Nurse, learning disability. Junto con la actividad fsica regular, esta dieta: Promueve la prdida de [place]. Ayuda a Medical sales representative de [place]. Ayuda a mejorar la forma en que el organismo [place] la insulina. Consejos para seguir Consulting civil engineer las etiquetas de los alimentos Siempre lea las etiquetas de los alimentos para Solicitor lo siguiente: La cantidad de grasas saturadas que tiene el alimento. Debe limitar la cantidad de grasas saturadas que consume. Las grasas saturadas se encuentran en los alimentos que provienen de Cando; entre ellos, la carne y los productos lcteos, como la [place], [place] queso y la [place]. La cantidad de fibra que tiene el alimento. Debe elegir alimentos ricos en fibra, como frutas, verduras, cereales integrales. Trate de consumir de 25 a 30 gramos (g) de [employer]. Azcar agregado. Evite los alimentos que tengan una gran cantidad de azcar agregada y jarabe de maz de alta fructosa. Evite los refrescos endulzados, el t endulzado, la limonada [place], las bebidas deportivas y los jugos que no sean 100 % jugo.  Trate de consumir alimentos con menos de 5 gramos de Engineer, mining. 4 gramos de azcar agregada equivalen a 1 cucharadita de azcar por porcin. Al cocinar Cuando cocine, use aceites cardiosaludables con alto contenido de grasas monoinsaturadas. Estos incluyen aceite de [place], aceite de canola y aceite de Chartered certified accountant. Limite los alimentos fritos o refritos. A la hora de cocinar los alimentos, opte por hornearlos, hervirlos, cocerlos al vapor y asarlos a la parrilla. Planificacin de las comidas Es recomendable que lleve un registro de la cantidad de caloras que come y [place]. La ingesta de la cantidad correcta de caloras lo ayudar a Barista un peso saludable. Consultar a un nutricionista puede ayudarlo a comenzar. Limite la frecuencia con la que come alimentos para llevar y comidas rpidas. Estos alimentos suelen ser [employer] en grasas, sal y azcar. Use el ndice glucmico (IG) para planificar las comidas. El ndice le informa con qu rapidez un alimento elevar su nivel de azcar en la sangre. Elija alimentos con bajo IG. Los alimentos con IG bajo son los que tienen un IG inferior a 55. Estos tardan ms tiempo en subir el nivel de azcar en la sangre. Un nutricionista puede ayudarlo a elegir los alimentos que son ms bajos en la escala del IG. Cada semana, trate de incluir algunas comidas que reemplacen la carne por frijoles o legumbres. Agregue pescado 2 o 3 veces por semana, especialmente pescados grasos cardiosaludables, como el salmn, las sardinas, la [place], el atn y Automotive engineer. Estilo de vida Tal vez deba seguir Clinical cytogeneticist. Esta dieta incluye gran cantidad de verduras, carnes magras o pescado, frutos secos y semillas, cereales integrales y frutas, as como aceites y  grasas saludables. Qu alimentos puedo comer?  Frutas Manzanas. Bananas. Peras. Uvas. Papaya. Ciruelas. Kiwi. Pomelo. Cerezas. Jinny Sanders. Hoover Brunette Deatra James. Espinaca. Guisantes. Remolachas. Coliflor. Repollo.  Brcoli. Zanahorias. Tomates. [place]. Christella Noa. Hierbas. Pimientos. Cebollas. Pepinos. Coles de Bruselas. ames y batatas. Cereales Alimentos con trigo integral u otros granos integrales, como panes, galletas, cereales y pasta. Salvado molido. Avena sin azcar. Trigo burgol. Cebada. Quinua. Arroz integral o salvaje. Tortillas de harina de maz o trigo integral. Carnes y otras protenas [address] Aves. Tofu. Frutos de mar y Oceanographer. Frijoles. Lentejas. Lcteos Productos lcteos descremados o semidescremados, como yogur, queso cottage o [place]. [employer] Bebidas sin azcar. T. Caf. Leche descremada o semidescremada. Productos alternativos a la [place], como [place] de soja, avena o almendra sin Paediatric nurse. Jugo de frutas naturales. Grasas y Arts development officer. Aceite de canola o de oliva. Frutos secos y Civil engineer, contracting de frutos secos. Semillas. Alios y condimentos [place]. Salsa de pepinillos. Salsa barbacoa y ktchup con bajo contenido de grasa y de International aid/development worker. Mayonesa con bajo contenido de grasa o sin grasa. Dulces y postres Dulces sin azcar. Es posible que los productos que se enumeran ms arriba no sean todos los alimentos y las bebidas que puede consumir. Consulte a un nutricionista para obtener ms informacin. Qu alimentos debo limitar o evitar? Cereales Arroz blanco. Pastas. Panes. Carnes y otras protenas Limite la carne roja a 1 o 2 veces por semana. Lcteos Lcteos enteros. Grasas y aceites Aceite de palma y de coco. Comidas fritas. Otros alimentos Alimentos procesados. Alimentos que contienen mucha sal (sodio) o Engineer, mining. Dulces y postres Dulces que Engineer, civil (consulting). Artculos de [employer], pasteles y [place]. [address] como t [place], [place], bebidas dulces heladas y [place]. Alcohol. Es posible que los productos que se enumeran ms arriba no sean todos los alimentos y las bebidas que Personnel officer. Consulte a un nutricionista  para obtener ms informacin. Dnde obtener ms informacin [employer] Mills of Diabetes and Digestive and Kidney Diseases [employer] de la Diabetes y las Enfermedades Digestivas y Renales): StageSync.si Esta informacin no tiene Theme park manager el consejo del mdico. Asegrese de hacerle al mdico cualquier pregunta que tenga. Document Revised: 08/30/2022 Document Reviewed: 08/30/2022 Elsevier Patient Education  2023 [employer].

## 2023-03-20 NOTE — Progress Notes (Signed)
Celso Amy, PA-C [address]  [address]  [place], [place] [address]  Main: [phone]  Fax: [phone]   Gastroenterology Consultation  Referring Provider:     Mort Sawyers, FNP Primary Care Physician:  Mort Sawyers, FNP Primary Gastroenterologist:  Dr. Wyline Mood / Celso Amy, PA-C  Reason for Consultation:     Elevated LFTs        HPI:   Jared Foster is a 57 y.o. y/o male referred for consultation & management  by Mort Sawyers, FNP.   He is here today with a Engineer, structural.  Labs 12/12/2022 showed mildly elevated ALT 88, all other LFTs normal.  Alk phos 98, AST 36, total bilirubin 0.4. Labs 09/2022 showed mildly elevated liver transaminases with AST 40, ALT 94.  Normal alk phos 70, total bilirubin 0.4.  Negative acute viral hepatitis A, B, and C labs. Lab 07/2022 showed normal CBC with hemoglobin 14.4, platelets 221.  Patient denies abdominal pain or any other GI symptoms.  Denies alcohol use or family history liver disease.  No new medications.  RUQ ultrasound 09/2022 showed normal gallbladder, no gallstones.  There was hepatic steatosis with no liver masses.  CT abdomen pelvis with contrast 07/2022 showed no acute abnormality to explain abdominal pain.  Incidental small left groin hernia containing fat and prostamegaly.  Last colonoscopy by Dr. Tobi Bastos 08/2017 showed mild inflammation in the ascending colon, nonbleeding medium internal hemorrhoids, and no polyps.  Good prep.  Colon biopsy showed mild nonspecific focal colitis in the ascending colon.  Past Medical History:  Diagnosis Date   Appendicitis 2003    Past Surgical History:  Procedure Laterality Date   APPENDECTOMY     COLONOSCOPY WITH PROPOFOL N/A 09/01/2017   Procedure: COLONOSCOPY WITH PROPOFOL;  Surgeon: Wyline Mood, MD;  Location[facility] ENDOSCOPY;  Service: Gastroenterology;  Laterality: N/A;    Prior to Admission medications   Medication Sig Start Date End Date  Taking? Authorizing Provider  ibuprofen (ADVIL) 800 MG tablet Take 1 tablet (800 mg total) by mouth every 8 (eight) hours as needed. 10/20/22   Copland, Karleen Hampshire, MD  metFORMIN (GLUCOPHAGE) 500 MG tablet Take 1 tablet (500 mg total) by mouth daily with breakfast. 12/12/22 06/10/23  Mort Sawyers, FNP  omeprazole (PRILOSEC) 20 MG capsule Take 1 capsule (20 mg total) by mouth daily. 12/12/22   Mort Sawyers, FNP  predniSONE (DELTASONE) 20 MG tablet Take two tablets once daily for five days 12/12/22   Mort Sawyers, FNP    No family history on file.   Social History   Tobacco Use   Smoking status: Never   Smokeless tobacco: Never  Vaping Use   Vaping Use: Never used  Substance Use Topics   Alcohol use: No   Drug use: No    Allergies as of 03/20/2023   (No Known Allergies)    Review of Systems:    All systems reviewed and negative except where noted in HPI.   Physical Exam:  There were no vitals taken for this visit. No LMP for male patient. Psych:  Alert and cooperative. Normal mood and affect. General:   Alert,  Well-developed, well-nourished, pleasant and cooperative in NAD Head:  Normocephalic and atraumatic. Eyes:  Sclera clear, no icterus.   Conjunctiva pink. Neck:  Supple; no masses or thyromegaly. Lungs:  Respirations even and unlabored.  Clear throughout to auscultation.   No wheezes, crackles, or rhonchi. No acute distress. Heart:  Regular rate and rhythm; no murmurs,  clicks, rubs, or gallops. Abdomen:  Normal bowel sounds.  No bruits.  Soft, and non-distended without masses, hepatosplenomegaly or hernias noted.  No Tenderness.  No guarding or rebound tenderness.    Neurologic:  Alert and oriented x3;  grossly normal neurologically. Psych:  Alert and cooperative. Normal mood and affect.  Imaging Studies: No results found.  Assessment and Plan:   Jared Pulse [place] Massimiliano Forbush is a 57 y.o. y/o male has been referred for mildly elevated ALT liver transaminase.  All  other LFTs normal.  Most likely due to hepatic steatosis seen on recent ultrasound.  Viral hepatitis A, B, and C labs were negative.  He does not drink alcohol.  Lengthy discussion regarding fatty liver disease.  Lifestyle modification discussed.  Follow-up in 3 months to repeat hepatic panel.  If LFTs become more elevated, then we will pursue further lab evaluation.  Elevated Liver Transaminases  Lab: Hepatic Panal  Recommend a low-fat diet, regular exercise, and weight loss. Patient education handout about fatty liver disease was given and discussed from up-to-date.   2.   Hepatic Steatosis  Lab: Fasting Lipid Panal  We gave him the names of PCPs in [place] and encouraged him to establish with PCP.  If cholesterol is elevated, then he will need to follow-up with PCP for treatment.  Follow up in 3 months.  Celso Amy, PA-C

## 2023-03-21 LAB — LIPID PANEL WITH LDL/HDL RATIO
Cholesterol, Total: 167 mg/dL (ref 100–199)
HDL: 41 mg/dL (ref >39)
LDL Chol Calc (NIH): 101 mg/dL — ABNORMAL HIGH (ref 0–99)
LDL/HDL Ratio: 2.5 ratio (ref 0.0–3.6)
Triglycerides: 139 mg/dL (ref 0–149)
VLDL Cholesterol Cal: 25 mg/dL (ref 5–40)

## 2023-03-21 LAB — HEPATIC FUNCTION PANEL
ALT: 62 IU/L — ABNORMAL HIGH (ref 0–44)
AST: 26 IU/L (ref 0–40)
Albumin: 4.4 g/dL (ref 3.8–4.9)
Alkaline Phosphatase: 99 IU/L (ref 44–121)
Bilirubin Total: 0.2 mg/dL (ref 0.0–1.2)
Bilirubin, Direct: <0.1 mg/dL (ref 0.00–0.40)
Total Protein: 6.8 g/dL (ref 6.0–8.5)

## 2023-03-24 ENCOUNTER — Encounter: Payer: Self-pay | Admitting: Family

## 2023-03-24 ENCOUNTER — Ambulatory Visit (INDEPENDENT_AMBULATORY_CARE_PROVIDER_SITE_OTHER): Payer: 59 | Admitting: Family

## 2023-03-24 VITALS — BP 160/110 | HR 76 | Temp 97.7°F | Ht 70.0 in | Wt 232.2 lb

## 2023-03-24 DIAGNOSIS — R0789 Other chest pain: Secondary | ICD-10-CM

## 2023-03-24 DIAGNOSIS — R6889 Other general symptoms and signs: Secondary | ICD-10-CM | POA: Diagnosis not present

## 2023-03-24 DIAGNOSIS — J029 Acute pharyngitis, unspecified: Secondary | ICD-10-CM

## 2023-03-24 DIAGNOSIS — I10 Essential (primary) hypertension: Secondary | ICD-10-CM | POA: Diagnosis not present

## 2023-03-24 DIAGNOSIS — J301 Allergic rhinitis due to pollen: Secondary | ICD-10-CM

## 2023-03-24 DIAGNOSIS — J0100 Acute maxillary sinusitis, unspecified: Secondary | ICD-10-CM

## 2023-03-24 DIAGNOSIS — Z20822 Contact with and (suspected) exposure to covid-19: Secondary | ICD-10-CM

## 2023-03-24 DIAGNOSIS — H65113 Acute and subacute allergic otitis media (mucoid) (sanguinous) (serous), bilateral: Secondary | ICD-10-CM

## 2023-03-24 LAB — POCT RAPID STREP A (OFFICE): Rapid Strep A Screen: NEGATIVE

## 2023-03-24 LAB — POCT INFLUENZA A/B
Influenza A, POC: NEGATIVE
Influenza B, POC: NEGATIVE

## 2023-03-24 LAB — POC COVID19 BINAXNOW: SARS Coronavirus 2 Ag: NEGATIVE

## 2023-03-24 MED ORDER — CETIRIZINE HCL 10 MG PO CAPS
1.0000 | ORAL_CAPSULE | Freq: Every day | ORAL | 0 refills | Status: AC
Start: 2023-03-24 — End: ?

## 2023-03-24 MED ORDER — AMOXICILLIN-POT CLAVULANATE 875-125 MG PO TABS
1.0000 | ORAL_TABLET | Freq: Two times a day (BID) | ORAL | 0 refills | Status: DC
Start: 2023-03-24 — End: 2023-04-07

## 2023-03-24 MED ORDER — PREDNISONE 10 MG (21) PO TBPK
ORAL_TABLET | ORAL | 0 refills | Status: DC
Start: 2023-03-24 — End: 2023-04-07

## 2023-03-24 MED ORDER — FLUTICASONE PROPIONATE 50 MCG/ACT NA SUSP
1.0000 | Freq: Two times a day (BID) | NASAL | 0 refills | Status: AC
Start: 2023-03-24 — End: ?

## 2023-03-24 MED ORDER — AMLODIPINE BESYLATE 5 MG PO TABS
5.0000 mg | ORAL_TABLET | Freq: Every day | ORAL | 3 refills | Status: DC
Start: 2023-03-24 — End: 2023-04-07

## 2023-03-24 NOTE — Patient Instructions (Addendum)
Se recomienda comenzar con flonase (fluticasone) sin receta y con zyrtec (cetirizine) sin receta.  Se recet Augmentin 875/125 mg por va oral dos veces al da durante [employer]. El paciente debe continuar con tylenol/ibuprofeno prn dolor sinusal. Contine con la prn humidificador y tambin se recomiendan duchas de vapor. instrucciones Si no hay mejora de los sntomas en 48 horas, por favor f/u Tambin me enviaron prednisona por dificultad para respirar.  Comience con amlodipine para su presin arterial alta. Quiero que controles tu presin arterial diariamente. Haga un seguimiento en dos semanas con su registro de presin arterial. Hayden Pedro FNP-C

## 2023-03-24 NOTE — Assessment & Plan Note (Signed)
Start amlodipine 5 mg once daily  Pt advised of the following:  Continue medication as prescribed. Monitor blood pressure periodically and/or when you feel symptomatic. Goal is <130/90 on average. Ensure that you have rested for 30 minutes prior to checking your blood pressure. Record your readings and bring them to your next visit if necessary.work on a low sodium diet. Pt to f/u in two weeks with blood pressure log

## 2023-03-24 NOTE — Progress Notes (Signed)
Established Patient Office Visit  Subjective:   Patient ID: Jared Foster, male    DOB: [birth year]  Age: 57 y.o. MRN: [id number]  CC: No chief complaint on file.   HPI: Jared Foster is a 57 y.o. male presenting on 03/24/2023 for No chief complaint on file.   HPI  For the last one month with sore throat, runny nose, headache and itchy feeling in his chest at times. He sometimes feels eyes are swollen and his ears itch at times. No fever or chills , but does feel hot at night time and will wake up feeling very thirsty in the am.   Not currently taking anything for allergies.  Symptoms have worsened a bit in the last few weeks.  Over the weekend felt a lot worse. Does feel chest congestion and can get short of breath at times.   No known h/o asthma.           ROS: Negative unless specifically indicated above in HPI.   Relevant past medical history reviewed and updated as indicated.   Allergies and medications reviewed and updated.   Current Outpatient Medications:    amLODipine (NORVASC) 5 MG tablet, Take 1 tablet (5 mg total) by mouth daily., Disp: 90 tablet, Rfl: 3   amoxicillin-clavulanate (AUGMENTIN) 875-125 MG tablet, Take 1 tablet by mouth 2 (two) times daily., Disp: 20 tablet, Rfl: 0   Cetirizine HCl 10 MG CAPS, Take 1 capsule (10 mg total) by mouth daily., Disp: 90 capsule, Rfl: 0   fluticasone (FLONASE) 50 MCG/ACT nasal spray, Place 1 spray into both nostrils 2 (two) times daily., Disp: 16 g, Rfl: 0   metFORMIN (GLUCOPHAGE) 500 MG tablet, Take 1 tablet (500 mg total) by mouth daily with breakfast., Disp: 90 tablet, Rfl: 1   predniSONE (STERAPRED UNI-PAK 21 TAB) 10 MG (21) TBPK tablet, Take as directed, Disp: 1 each, Rfl: 0  No Known Allergies  Objective:   BP (!) 160/110   Pulse 76   Temp 97.7 F (36.5 C) (Temporal)   Ht 5\' 10"  (1.778 m)   Wt 232 lb 3.2 oz (105.3 kg)   SpO2 98%   BMI 33.32 kg/m    Physical  Exam Vitals reviewed.  Constitutional:      General: He is not in acute distress.    Appearance: Normal appearance. He is obese. He is not ill-appearing, toxic-appearing or diaphoretic.  HENT:     Head: Normocephalic.     Right Ear: A middle ear effusion is present. Tympanic membrane is erythematous and bulging.     Left Ear: A middle ear effusion is present. Tympanic membrane is erythematous and bulging.     Nose: Congestion present. No nasal tenderness.     Right Turbinates: Enlarged and swollen.     Left Turbinates: Enlarged and swollen.     Right Sinus: Maxillary sinus tenderness and frontal sinus tenderness present.     Left Sinus: Maxillary sinus tenderness and frontal sinus tenderness present.     Mouth/Throat:     Mouth: Mucous membranes are moist.     Pharynx: Posterior oropharyngeal erythema present.     Tonsils: 2+ on the right. 2+ on the left.  Eyes:     Pupils: Pupils are equal, round, and reactive to light.  Cardiovascular:     Rate and Rhythm: Normal rate and regular rhythm.  Pulmonary:     Effort: Pulmonary effort is normal.     Breath sounds: Normal  breath sounds. No wheezing.  Musculoskeletal:        General: Normal range of motion.     Cervical back: Normal range of motion.  Neurological:     General: No focal deficit present.     Mental Status: He is alert and oriented to person, place, and time. Mental status is at baseline.  Psychiatric:        Mood and Affect: Mood normal.        Behavior: Behavior normal.        Thought Content: Thought content normal.        Judgment: Judgment normal.     Assessment & Plan:  Flu-like symptoms -     POCT Influenza A/B  Sore throat -     POCT rapid strep A  Suspected COVID-19 virus infection -     POC COVID-19 BinaxNow  Chest tightness  Seasonal allergic rhinitis due to pollen -     Fluticasone Propionate; Place 1 spray into both nostrils 2 (two) times daily.  Dispense: 16 g; Refill: 0 -     Cetirizine HCl;  Take 1 capsule (10 mg total) by mouth daily.  Dispense: 90 capsule; Refill: 0  Non-recurrent acute allergic otitis media of both ears -     Amoxicillin-Pot Clavulanate; Take 1 tablet by mouth 2 (two) times daily.  Dispense: 20 tablet; Refill: 0 -     predniSONE; Take as directed  Dispense: 1 each; Refill: 0  Acute non-recurrent maxillary sinusitis -     Amoxicillin-Pot Clavulanate; Take 1 tablet by mouth 2 (two) times daily.  Dispense: 20 tablet; Refill: 0 -     predniSONE; Take as directed  Dispense: 1 each; Refill: 0  Primary hypertension Assessment & Plan: Start amlodipine 5 mg once daily  Pt advised of the following:  Continue medication as prescribed. Monitor blood pressure periodically and/or when you feel symptomatic. Goal is <130/90 on average. Ensure that you have rested for 30 minutes prior to checking your blood pressure. Record your readings and bring them to your next visit if necessary.work on a low sodium diet. Pt to f/u in two weeks with blood pressure log  Orders: -     amLODIPine Besylate; Take 1 tablet (5 mg total) by mouth daily.  Dispense: 90 tablet; Refill: 3     Follow up plan: Return in about 3 months (around 06/24/2023) for f/u diabetes.  Mort Sawyers, FNP

## 2023-03-25 ENCOUNTER — Telehealth: Payer: Self-pay

## 2023-03-25 NOTE — Telephone Encounter (Signed)
Called patient to let him know the below information. Patient agreed and had no further questions. Patient's follow up appointment was rescheduled.

## 2023-03-25 NOTE — Telephone Encounter (Signed)
-----   Message from Celso Amy, [place] sent at 03/24/2023  3:48 PM EDT ----- Regarding: Lab results  ----- Message ----- From: Nell Range Lab Results In Sent: 03/21/2023   5:38 AM EDT To: Celso Amy, PA-C

## 2023-04-07 ENCOUNTER — Encounter: Payer: Self-pay | Admitting: Family

## 2023-04-07 ENCOUNTER — Ambulatory Visit (INDEPENDENT_AMBULATORY_CARE_PROVIDER_SITE_OTHER): Payer: 59 | Admitting: Family

## 2023-04-07 VITALS — BP 150/100 | HR 76 | Temp 97.6°F | Ht 70.0 in | Wt 229.8 lb

## 2023-04-07 DIAGNOSIS — E785 Hyperlipidemia, unspecified: Secondary | ICD-10-CM

## 2023-04-07 DIAGNOSIS — Z23 Encounter for immunization: Secondary | ICD-10-CM

## 2023-04-07 DIAGNOSIS — I739 Peripheral vascular disease, unspecified: Secondary | ICD-10-CM

## 2023-04-07 DIAGNOSIS — B351 Tinea unguium: Secondary | ICD-10-CM | POA: Diagnosis not present

## 2023-04-07 DIAGNOSIS — E119 Type 2 diabetes mellitus without complications: Secondary | ICD-10-CM | POA: Diagnosis not present

## 2023-04-07 DIAGNOSIS — R0989 Other specified symptoms and signs involving the circulatory and respiratory systems: Secondary | ICD-10-CM | POA: Diagnosis not present

## 2023-04-07 DIAGNOSIS — I10 Essential (primary) hypertension: Secondary | ICD-10-CM | POA: Diagnosis not present

## 2023-04-07 DIAGNOSIS — Z7984 Long term (current) use of oral hypoglycemic drugs: Secondary | ICD-10-CM | POA: Diagnosis not present

## 2023-04-07 DIAGNOSIS — E1142 Type 2 diabetes mellitus with diabetic polyneuropathy: Secondary | ICD-10-CM | POA: Insufficient documentation

## 2023-04-07 DIAGNOSIS — K760 Fatty (change of) liver, not elsewhere classified: Secondary | ICD-10-CM | POA: Insufficient documentation

## 2023-04-07 DIAGNOSIS — Z0001 Encounter for general adult medical examination with abnormal findings: Secondary | ICD-10-CM | POA: Diagnosis not present

## 2023-04-07 DIAGNOSIS — R7989 Other specified abnormal findings of blood chemistry: Secondary | ICD-10-CM | POA: Diagnosis not present

## 2023-04-07 LAB — POCT GLYCOSYLATED HEMOGLOBIN (HGB A1C): Hemoglobin A1C: 6 % — AB (ref 4.0–5.6)

## 2023-04-07 LAB — MICROALBUMIN / CREATININE URINE RATIO
Creatinine,U: 97.6 mg/dL
Microalb Creat Ratio: 2 mg/g (ref 0.0–30.0)
Microalb, Ur: 2 mg/dL — ABNORMAL HIGH (ref 0.0–1.9)

## 2023-04-07 MED ORDER — CICLOPIROX 8 % EX SOLN
Freq: Every day | CUTANEOUS | 0 refills | Status: AC
Start: 2023-04-07 — End: ?

## 2023-04-07 MED ORDER — AMLODIPINE BESYLATE 5 MG PO TABS
5.0000 mg | ORAL_TABLET | Freq: Every day | ORAL | 3 refills | Status: AC
Start: 2023-04-07 — End: ?

## 2023-04-07 NOTE — Progress Notes (Signed)
Established Patient Office Visit  Subjective:      CC:  Chief Complaint  Patient presents with   Medical Management of Chronic Issues   Annual Exam    HPI: Jared Foster is a 57 y.o. male presenting on 04/07/2023 for Medical Management of Chronic Issues and Annual Exam Also here for annual exam.   Accompanied by his nephew who is interpreting for him as he is predominantly spanish speaking.   Exercise: walks throughout the week  Working on diabetic and low cholesterol diet  Not utd with eye exams or dental exams Does not have living will.   Seen 5/21 for AOM, was given augmentin and prednisone.   Elevated LDL:  Pt with fatty liver disease and diabetes. Goal < 70  Lab Results  Component Value Date   CHOL 167 03/20/2023   HDL 41 03/20/2023   LDLCALC 101 (H) 03/20/2023   LDLDIRECT 81.0 12/18/2021   TRIG 139 03/20/2023   CHOLHDL 4 12/18/2021   Wt Readings from Last 3 Encounters:  04/07/23 229 lb 12.8 oz (104.2 kg)  03/24/23 232 lb 3.2 oz (105.3 kg)  03/20/23 230 lb 9.6 oz (104.6 kg)    Elevated LFT: seeing GI. Recent ALT check 5/17 almost back to normal. Stablizing.  DM2: tolerating the metformin 500 mg once daily, he is taking this in the am.  Last eye exam has been over one year ago. He did not realize he needs annual eye exam.  Lab Results  Component Value Date   HGBA1C 5.9 (A) 12/12/2022   Shingles vaccination: had one shot 12/16/21 but has not completed the series   HTN: ran out of amlodipine 5 mg, today blood pressure 150/90 he states at home has been looking about the same. Denies cp palp and or sob.    Social history:  Relevant past medical, surgical, family and social history reviewed and updated as indicated. Interim medical history since our last visit reviewed.  Allergies and medications reviewed and updated.  DATA REVIEWED: CHART IN EPIC     ROS: Negative unless specifically indicated above in HPI.    Current  Outpatient Medications:    Cetirizine HCl 10 MG CAPS, Take 1 capsule (10 mg total) by mouth daily., Disp: 90 capsule, Rfl: 0   ciclopirox (PENLAC) 8 % solution, Apply topically at bedtime. Apply over nail and surrounding skin. Apply daily over previous coat. After seven (7) days, may remove with alcohol and continue cycle., Disp: 6.6 mL, Rfl: 0   fluticasone (FLONASE) 50 MCG/ACT nasal spray, Place 1 spray into both nostrils 2 (two) times daily., Disp: 16 g, Rfl: 0   metFORMIN (GLUCOPHAGE) 500 MG tablet, Take 1 tablet (500 mg total) by mouth daily with breakfast., Disp: 90 tablet, Rfl: 1   amLODipine (NORVASC) 5 MG tablet, Take 1 tablet (5 mg total) by mouth daily., Disp: 90 tablet, Rfl: 3      Objective:    BP (!) 150/100   Pulse 76   Temp 97.6 F (36.4 C) (Temporal)   Ht 5\' 10"  (1.778 m)   Wt 229 lb 12.8 oz (104.2 kg)   SpO2 98%   BMI 32.97 kg/m   Wt Readings from Last 3 Encounters:  04/07/23 229 lb 12.8 oz (104.2 kg)  03/24/23 232 lb 3.2 oz (105.3 kg)  03/20/23 230 lb 9.6 oz (104.6 kg)    Physical Exam Constitutional:      General: He is not in acute distress.    Appearance:  Normal appearance. He is normal weight. He is not ill-appearing, toxic-appearing or diaphoretic.  Cardiovascular:     Rate and Rhythm: Normal rate and regular rhythm.     Pulses:          Dorsalis pedis pulses are 2+ on the right side and 2+ on the left side.       Posterior tibial pulses are 1+ on the right side and 1+ on the left side.  Pulmonary:     Effort: Pulmonary effort is normal.  Musculoskeletal:        General: Normal range of motion.  Neurological:     General: No focal deficit present.     Mental Status: He is alert and oriented to person, place, and time. Mental status is at baseline.  Psychiatric:        Mood and Affect: Mood normal.        Behavior: Behavior normal.        Thought Content: Thought content normal.        Judgment: Judgment normal.     Diabetic Foot Form -  Detailed   Diabetic Foot Exam - detailed Can the patient see the bottom of their feet?: Yes Are the shoes appropriate in style and fit?: Yes Is there swelling or and abnormal foot shape?: No Is there a claw toe deformity?: No Is there elevated skin temparature?: No Is there foot or ankle muscle weakness?: No Normal Range of Motion: Yes Semmes-Weinstein Monofilament Test R Site 1-Great Toe: Neg L Site 1-Great Toe: Neg     Comments: Site 9 without sensation bil           Assessment & Plan:  Controlled type 2 diabetes mellitus without complication, without long-term current use of insulin (HCC) Assessment & Plan: Ordered hga1c today pending results. Work on diabetic diet and exercise as tolerated. Yearly foot exam, and annual eye exam.  Referral placed for ophthalmology for eye exam.  Urine microalbumin ordered today pending results.   Orders: -     POCT glycosylated hemoglobin (Hb A1C) -     Ambulatory referral to Ophthalmology  Primary hypertension Assessment & Plan: Restart amlodipine as you ran out.  Pt advised of the following:  Continue medication as prescribed. Monitor blood pressure periodically and/or when you feel symptomatic. Goal is <130/90 on average. Ensure that you have rested for 30 minutes prior to checking your blood pressure. Record your readings and bring them to your next visit if necessary.work on a low sodium diet.    Orders: -     Microalbumin / creatinine urine ratio -     amLODIPine Besylate; Take 1 tablet (5 mg total) by mouth daily.  Dispense: 90 tablet; Refill: 3  Diabetic peripheral neuropathy associated with type 2 diabetes mellitus (HCC) Assessment & Plan: New finding on foot exam  Ordering ABI as well for intermittent claudification to r/o Venous stasis and or PAD     Intermittent claudication (HCC) -     VAS [place] ABI WITH/WO TBI; Future  Fungal toenail infection -     Ciclopirox; Apply topically at bedtime. Apply over nail and  surrounding skin. Apply daily over previous coat. After seven (7) days, may remove with alcohol and continue cycle.  Dispense: 6.6 mL; Refill: 0  Diminished pulses in lower extremity -     VAS [place] ABI WITH/WO TBI; Future  Encounter for general adult medical examination with abnormal findings  Hyperlipidemia, unspecified hyperlipidemia type Assessment & Plan: Recent lipid panel reviewed,  not at goal. However pt agrees to work on diet and exercise. If no improvement next lipid panel will consider statin to help get LDL at goal.    Elevated liver function tests Assessment & Plan: Improving.  Continue f/u as scheduled with GI    Fatty liver     Return in about 6 months (around 10/07/2023) for f/u blood pressure, f/u diabetes.  Mort Sawyers, MSN, APRN, FNP-C [facility]

## 2023-04-07 NOTE — Assessment & Plan Note (Signed)
Improving.  Continue f/u as scheduled with GI

## 2023-04-07 NOTE — Assessment & Plan Note (Signed)
Recent lipid panel reviewed, not at goal. However pt agrees to work on diet and exercise. If no improvement next lipid panel will consider statin to help get LDL at goal.

## 2023-04-07 NOTE — Progress Notes (Signed)
Needs spanish interpretor.  Stable.  Continue with metformin.

## 2023-04-07 NOTE — Assessment & Plan Note (Signed)
New finding on foot exam  Ordering ABI as well for intermittent claudification to r/o Venous stasis and or PAD

## 2023-04-07 NOTE — Assessment & Plan Note (Signed)
Ordered hga1c today pending results. Work on diabetic diet and exercise as tolerated. Yearly foot exam, and annual eye exam.  Referral placed for ophthalmology for eye exam.  Urine microalbumin ordered today pending results.

## 2023-04-07 NOTE — Assessment & Plan Note (Signed)
Restart amlodipine as you ran out.  Pt advised of the following:  Continue medication as prescribed. Monitor blood pressure periodically and/or when you feel symptomatic. Goal is <130/90 on average. Ensure that you have rested for 30 minutes prior to checking your blood pressure. Record your readings and bring them to your next visit if necessary.work on a low sodium diet.

## 2023-04-07 NOTE — Progress Notes (Signed)
Urine microalbumin positive however by one point. Let's repeat next follow up appointment. This is just to assess for stressors to kidneys from long term diabetes, high blood pressure.

## 2023-04-07 NOTE — Patient Instructions (Addendum)
  I have sent in an order for ultrasound of the legs to check circulation. They will call him directly to schedule this appointment.   A referral was placed today for eye doctor for yearly diabetic eye exam.  Please let us know if you have not heard back within 2 weeks about the referral.  Restart amlodipine once daily  Monitor blood pressure.   Regards,   Mort Sawyers FNP-C

## 2023-05-04 ENCOUNTER — Other Ambulatory Visit: Payer: Self-pay | Admitting: Family

## 2023-05-04 DIAGNOSIS — E119 Type 2 diabetes mellitus without complications: Secondary | ICD-10-CM

## 2023-05-04 NOTE — Telephone Encounter (Signed)
Can we call pt?  He has upcoming appt with Dr. Clent Ridges and last time him and I spoke he said he didn't know why he was set up with that office because he was not planning on a TOC. Can we make sure he knows that it looks like he is setting up with another doctor instead of here. It's ok if he is planning to do that but last time we spoke about it he seemed confused.

## 2023-05-04 NOTE — Telephone Encounter (Signed)
Called and spoke with patient using an interpretor. Patient would like to continue his care here with Tabitha. Canceled appt with Dr. Clent Ridges. Answered all questions.

## 2023-05-11 ENCOUNTER — Ambulatory Visit (INDEPENDENT_AMBULATORY_CARE_PROVIDER_SITE_OTHER): Payer: 59 | Admitting: Orthopedic Surgery

## 2023-05-11 ENCOUNTER — Other Ambulatory Visit: Payer: Self-pay

## 2023-05-11 DIAGNOSIS — S83241D Other tear of medial meniscus, current injury, right knee, subsequent encounter: Secondary | ICD-10-CM

## 2023-05-11 DIAGNOSIS — M25561 Pain in right knee: Secondary | ICD-10-CM

## 2023-05-12 ENCOUNTER — Encounter: Payer: Self-pay | Admitting: Orthopedic Surgery

## 2023-05-12 NOTE — Progress Notes (Signed)
Office Visit Note   Patient: Jared Foster           Date of Birth: 1966/02/23           MRN: [id number] Visit Date: 05/11/2023 Requested by: Mort Sawyers, FNP [address] [place],  [place] [address] PCP: Mort Sawyers, FNP  Subjective: Chief Complaint  Patient presents with   Right Knee - Pain   Left Leg - Pain    HPI: Jared Foster is a 57 y.o. male who presents to the office reporting right knee pain.  Patient injured the right knee at work in October 2023.  Has been seen in the past by Geralyn Flash, PA.  Suggested MRI but never done because of Workmen's Comp. issues.  Patient reports swelling popping locking weakness and giving way along with medial sided pain.  Patient was carrying a box when he had an impact injury when the box fell.  The box impacted the medial aspect of his knee.  He describes global pain in the knee but worse on the medial side.  Patient also reports left hip pain and review of the chart and radiographs demonstrates end-stage hip arthritis which is being treated by Dr. Magnus Ivan..                ROS: All systems reviewed are negative as they relate to the chief complaint within the history of present illness.  Patient denies fevers or chills.  Assessment & Plan: Visit Diagnoses:  1. Tear of medial meniscus of right knee, current, unspecified tear type, subsequent encounter     Plan: Impression is likely right knee meniscal pathology with mild effusion medial joint line tenderness today.  Patient has had injections conservative treatment consisting of activity modification stretching and strengthening as well as over-the-counter medication.  9 months of symptoms localizing to the medial side of the knee based on mechanism of injury and examination today I think it is highly likely he has medial meniscal pathology.  Plan MRI scan to evaluate medial meniscal pathology.  Follow-up after that study.  Follow-Up  Instructions: No follow-ups on file.   Orders:  No orders of the defined types were placed in this encounter.  No orders of the defined types were placed in this encounter.     Procedures: No procedures performed   Clinical Data: No additional findings.  Objective: Vital Signs: There were no vitals taken for this visit.  Physical Exam:  Constitutional: Patient appears well-developed HEENT:  Head: Normocephalic Eyes:EOM are normal Neck: Normal range of motion Cardiovascular: Normal rate Pulmonary/chest: Effort normal Neurologic: Patient is alert Skin: Skin is warm Psychiatric: Patient has normal mood and affect  Ortho Exam: Ortho exam demonstrates slightly antalgic gait to the right.  Trace effusion right knee.  Collateral ligaments feel stable but this is a somewhat guarded exam.  Stable to varus and valgus stress at 0 30 and 90 degrees.  McMurray compression testing positive for medial compartment pathology.  Medial joint line tenderness is present more so than lateral joint line tenderness.  Extensor mechanism intact.  Specialty Comments:  No specialty comments available.  Imaging: No results found.   PMFS History: Patient Active Problem List   Diagnosis Date Noted   Diabetic peripheral neuropathy associated with type 2 diabetes mellitus (HCC) 04/07/2023   Diminished pulses in lower extremity 04/07/2023   Fatty liver 04/07/2023   Primary hypertension 03/24/2023   Controlled type 2 diabetes mellitus without complication,  without long-term current use of insulin (HCC) 12/12/2022   Elevated liver function tests 09/05/2022   Internal hemorrhoids 12/18/2021   Hyperlipidemia 12/18/2021   Elevated PSA 12/18/2021   Nephrolithiasis 12/18/2021   Past Medical History:  Diagnosis Date   Appendicitis 2003    No family history on file.  Past Surgical History:  Procedure Laterality Date   APPENDECTOMY     COLONOSCOPY WITH PROPOFOL N/A 09/01/2017   Procedure:  COLONOSCOPY WITH PROPOFOL;  Surgeon: Wyline Mood, MD;  Location: [facility] ENDOSCOPY;  Service: Gastroenterology;  Laterality: N/A;   Social History   Occupational History   Occupation: electricity  Tobacco Use   Smoking status: Never   Smokeless tobacco: Never  Vaping Use   Vaping Use: Never used  Substance and Sexual Activity   Alcohol use: No   Drug use: No   Sexual activity: Yes    Partners: Male

## 2023-05-19 ENCOUNTER — Encounter: Payer: Self-pay | Admitting: Family Medicine

## 2023-05-25 ENCOUNTER — Encounter: Payer: Self-pay | Admitting: Orthopedic Surgery

## 2023-05-26 ENCOUNTER — Encounter: Payer: Self-pay | Admitting: Orthopedic Surgery

## 2023-05-27 ENCOUNTER — Ambulatory Visit
Admission: RE | Admit: 2023-05-27 | Discharge: 2023-05-27 | Disposition: A | Discharge status: Home / Self Care | Payer: 59 | Source: Ambulatory Visit | Attending: Orthopedic Surgery | Admitting: Orthopedic Surgery

## 2023-05-27 DIAGNOSIS — M23331 Other meniscus derangements, other medial meniscus, right knee: Secondary | ICD-10-CM | POA: Diagnosis not present

## 2023-05-27 DIAGNOSIS — M25561 Pain in right knee: Secondary | ICD-10-CM | POA: Diagnosis not present

## 2023-06-01 ENCOUNTER — Other Ambulatory Visit: Payer: Self-pay | Admitting: Family

## 2023-06-01 DIAGNOSIS — E119 Type 2 diabetes mellitus without complications: Secondary | ICD-10-CM

## 2023-06-08 NOTE — Progress Notes (Signed)
No, can we have patient follow-up with Dr August Saucer?

## 2023-06-08 NOTE — Progress Notes (Signed)
Does he have f/u?

## 2023-06-09 ENCOUNTER — Telehealth: Payer: Self-pay

## 2023-06-09 DIAGNOSIS — E119 Type 2 diabetes mellitus without complications: Secondary | ICD-10-CM | POA: Diagnosis not present

## 2023-06-09 DIAGNOSIS — Z6831 Body mass index (BMI) 31.0-31.9, adult: Secondary | ICD-10-CM | POA: Diagnosis not present

## 2023-06-09 DIAGNOSIS — Z833 Family history of diabetes mellitus: Secondary | ICD-10-CM | POA: Diagnosis not present

## 2023-06-09 DIAGNOSIS — E669 Obesity, unspecified: Secondary | ICD-10-CM | POA: Diagnosis not present

## 2023-06-09 DIAGNOSIS — K219 Gastro-esophageal reflux disease without esophagitis: Secondary | ICD-10-CM | POA: Diagnosis not present

## 2023-06-09 DIAGNOSIS — I10 Essential (primary) hypertension: Secondary | ICD-10-CM | POA: Diagnosis not present

## 2023-06-09 DIAGNOSIS — Z7984 Long term (current) use of oral hypoglycemic drugs: Secondary | ICD-10-CM | POA: Diagnosis not present

## 2023-06-09 DIAGNOSIS — K760 Fatty (change of) liver, not elsewhere classified: Secondary | ICD-10-CM | POA: Diagnosis not present

## 2023-06-09 NOTE — Telephone Encounter (Signed)
-----   Message from [facility] sent at 06/08/2023 10:52 AM EDT ----- No, can we have patient follow-up with Dr August Saucer?

## 2023-06-09 NOTE — Telephone Encounter (Signed)
Pls schedule appt to review MRI if w/c will approve

## 2023-06-18 ENCOUNTER — Telehealth: Payer: Self-pay | Admitting: Orthopedic Surgery

## 2023-06-18 ENCOUNTER — Ambulatory Visit: Payer: 59 | Admitting: Orthopedic Surgery

## 2023-06-18 ENCOUNTER — Encounter: Payer: Self-pay | Admitting: Orthopedic Surgery

## 2023-06-18 DIAGNOSIS — M1711 Unilateral primary osteoarthritis, right knee: Secondary | ICD-10-CM

## 2023-06-18 DIAGNOSIS — M1612 Unilateral primary osteoarthritis, left hip: Secondary | ICD-10-CM | POA: Diagnosis not present

## 2023-06-18 DIAGNOSIS — M1712 Unilateral primary osteoarthritis, left knee: Secondary | ICD-10-CM

## 2023-06-18 NOTE — Telephone Encounter (Signed)
Auth needed for right knee gel  

## 2023-06-18 NOTE — Telephone Encounter (Signed)
Pt stated they were supposed to come back in 1 week fto get an injection in the back is that supposed to be with Jared Foster or Jared Foster and if it is with Jared Foster he will need to be worked in no space on the schedule

## 2023-06-18 NOTE — Progress Notes (Signed)
Office Visit Note   Patient: Jared Foster           Date of Birth: [birth year]           MRN: [id number] Visit Date: 06/18/2023 Requested by: Mort Sawyers, FNP [address] [place],  [place] [address] PCP: Mort Sawyers, FNP  Subjective: Chief Complaint  Patient presents with   Other     Review scan    HPI: Jared Foster is a 57 y.o. male who presents to the office reporting right knee pain.  Since she was last seen has had an MRI scan which is reviewed.  That scan does show patellofemoral arthritis along with degenerative complex medial meniscal tear with no unstable flaps and significant arthritis in the medial compartment of the knee.  Patient describes global pain not only localizing to the medial aspect of the knee but everywhere around the knee.  Started about 10 months ago.  Has about 10 minutes of walking endurance only.  Also has severe left hip arthritis which was exacerbated by his Workmen's Comp. injury 10 months ago.  This is by report from Dr. Magnus Ivan.  The MRI scan is reviewed with the patient at length.  The medial meniscal tear is more of a radial type tear with significant involvement of most of the anterior posterior width of the meniscus.  In addition he has severe arthritis over about half of that medial compartment..                ROS: All systems reviewed are negative as they relate to the chief complaint within the history of present illness.  Patient denies fevers or chills.  Assessment & Plan: Visit Diagnoses:  1. Unilateral primary osteoarthritis, left hip   2. Unilateral primary osteoarthritis, left knee   3. Arthritis of right knee     Plan: Impression is right knee pain with global pain and knee pathology which is not predictably going to be helped with arthroscopic intervention.  Discussed with him that he may get 10 to 15% improvement at most from any type of arthroscopic debridement.  The meniscal  tear does not really look unstable or have a flap component but it looks to be primarily degenerated torn with significant arthritis in that medial compartment.  I think he is likely to undergo knee replacement within 1 to 3 years with or without any arthroscopic intervention.  We did perform an intra-articular cortisone injection today and we will plan to do gel injection after the cortisone shot wears off.  He would also like to get his hip injected on the left-hand side because he is not quite ready for hip replacement yet.  We will also get that arranged within the next week.  5-month return for clinical recheck on the right knee.  Follow-Up Instructions: No follow-ups on file.   Orders:  No orders of the defined types were placed in this encounter.  No orders of the defined types were placed in this encounter.     Procedures: Large Joint Inj: R knee on 06/18/2023 2:54 PM Indications: diagnostic evaluation, joint swelling and pain Details: 18 G 1.5 in needle, superolateral approach  Arthrogram: No  Medications: 5 mL lidocaine 1 %; 40 mg methylPREDNISolone acetate 40 MG/ML; 4 mL bupivacaine 0.25 % Outcome: tolerated well, no immediate complications Procedure, treatment alternatives, risks and benefits explained, specific risks discussed. Consent was given by the patient. Immediately prior to procedure a time out  was called to verify the correct patient, procedure, equipment, support staff and site/side marked as required. Patient was prepped and draped in the usual sterile fashion.       Clinical Data: No additional findings.  Objective: Vital Signs: There were no vitals taken for this visit.  Physical Exam:  Constitutional: Patient appears well-developed HEENT:  Head: Normocephalic Eyes:EOM are normal Neck: Normal range of motion Cardiovascular: Normal rate Pulmonary/chest: Effort normal Neurologic: Patient is alert Skin: Skin is warm Psychiatric: Patient has normal mood  and affect  Ortho Exam: Ortho exam demonstrates full range of motion of that right knee.  Has medial lateral and patellofemoral retinacular tenderness.  Flexion to 120.  Collateral and cruciate ligaments are stable.  Mild patellofemoral crepitus is present.  No effusion in the right knee.  McMurray compression testing is positive for both medial and lateral compartment symptoms.  No groin pain with internal or external rotation on the right.  He does have stiffness and pain on the left with internal and external rotation consistent with his known diagnosis of hip arthritis.  Specialty Comments:  No specialty comments available.  Imaging: No results found.   PMFS History: Patient Active Problem List   Diagnosis Date Noted   Diabetic peripheral neuropathy associated with type 2 diabetes mellitus (HCC) 04/07/2023   Diminished pulses in lower extremity 04/07/2023   Fatty liver 04/07/2023   Primary hypertension 03/24/2023   Controlled type 2 diabetes mellitus without complication, without long-term current use of insulin (HCC) 12/12/2022   Elevated liver function tests 09/05/2022   Internal hemorrhoids 12/18/2021   Hyperlipidemia 12/18/2021   Elevated PSA 12/18/2021   Nephrolithiasis 12/18/2021   Past Medical History:  Diagnosis Date   Appendicitis 2003    History reviewed. No pertinent family history.  Past Surgical History:  Procedure Laterality Date   APPENDECTOMY     COLONOSCOPY WITH PROPOFOL N/A 09/01/2017   Procedure: COLONOSCOPY WITH PROPOFOL;  Surgeon: Wyline Mood, MD;  Location: [facility] ENDOSCOPY;  Service: Gastroenterology;  Laterality: N/A;   Social History   Occupational History   Occupation: electricity  Tobacco Use   Smoking status: Never   Smokeless tobacco: Never  Vaping Use   Vaping status: Never Used  Substance and Sexual Activity   Alcohol use: No   Drug use: No   Sexual activity: Yes    Partners: Male

## 2023-06-18 NOTE — Telephone Encounter (Signed)
VOB submitted for Monovisc, right knee  

## 2023-06-19 ENCOUNTER — Ambulatory Visit: Payer: Self-pay | Admitting: Physician Assistant

## 2023-06-19 MED ORDER — BUPIVACAINE HCL 0.25 % IJ SOLN
4.0000 mL | INTRAMUSCULAR | Status: AC | PRN
Start: 2023-06-18 — End: 2023-06-18
  Administered 2023-06-18: 4 mL via INTRA_ARTICULAR

## 2023-06-19 MED ORDER — METHYLPREDNISOLONE ACETATE 40 MG/ML IJ SUSP
40.0000 mg | INTRAMUSCULAR | Status: AC | PRN
Start: 2023-06-18 — End: 2023-06-18
  Administered 2023-06-18: 40 mg via INTRA_ARTICULAR

## 2023-06-19 MED ORDER — LIDOCAINE HCL 1 % IJ SOLN
5.0000 mL | INTRAMUSCULAR | Status: AC | PRN
Start: 2023-06-18 — End: 2023-06-18
  Administered 2023-06-18: 5 mL

## 2023-06-19 NOTE — Telephone Encounter (Signed)
He told me that he wanted to get his hip injected again.  Does not really want to do hip replacement yet.  I would just have him come in to see Jared Foster to get his hip injected sometime next week.  Thanks

## 2023-06-25 ENCOUNTER — Ambulatory Visit: Payer: 59 | Admitting: Urology

## 2023-07-02 ENCOUNTER — Encounter: Payer: Self-pay | Admitting: Urology

## 2023-07-02 ENCOUNTER — Ambulatory Visit (INDEPENDENT_AMBULATORY_CARE_PROVIDER_SITE_OTHER): Payer: 59 | Admitting: Urology

## 2023-07-02 ENCOUNTER — Telehealth: Payer: Self-pay

## 2023-07-02 VITALS — BP 145/88 | HR 77 | Ht 70.0 in | Wt 229.0 lb

## 2023-07-02 DIAGNOSIS — Z87438 Personal history of other diseases of male genital organs: Secondary | ICD-10-CM

## 2023-07-02 DIAGNOSIS — Z8042 Family history of malignant neoplasm of prostate: Secondary | ICD-10-CM

## 2023-07-02 DIAGNOSIS — R972 Elevated prostate specific antigen [PSA]: Secondary | ICD-10-CM | POA: Diagnosis not present

## 2023-07-02 NOTE — Progress Notes (Signed)
   07/02/2023 9:16 AM   Domingo Pulse Marguerita Beards Loden Alber 14-Oct-1966 [id number]  Reason for visit: Follow up of prostatitis, family history of prostate cancer, elevated PSA  HPI: 57 year old male with a family history of lethal prostate cancer(father age 67) who had an episode of severe prostatitis in July 2022 that resolved with a course of Cipro.  There were no PSA values prior.  DRE at that time showed an enlarged prostate, but no nodules or masses, and prostate measured 73 g on CT at that time.  We have opted to continue PSA screening every 2 years based on the guideline recommendations.  He denies any urinary changes over the last year, no urinary complaints today.  Most recent PSA from August 2023 was normal at 3.7, with very reassuring PSA density of 0.05  RTC 1 year PSA reflex to free, if stable/reassuring PSA density, can continue screening every other year with PCP   Sondra Come, MD  [facility] [address], [address] [place], [place] [address] [phone]

## 2023-07-02 NOTE — Patient Instructions (Signed)
Prueba de deteccin del cncer de prstata Prostate Cancer Screening  La prueba de deteccin del cncer de prstata es una prueba que se realiza para detectar la presencia de cncer de prstata en los hombres. La prstata es una glndula del tamao de una nuez que se encuentra debajo de la vejiga y delante del recto en los hombres. La funcin de la prstata es agregar lquido en el semen Teacher, English as a foreign language. El cncer de prstata es uno de los tipos ms comunes de [employer]. Quin debe realizarse estudios para la deteccin de cncer de prstata? Las [employer] Auto la realizacin de una prueba de deteccin varan segn la edad y otros factores de [place], as como entre las organizaciones profesionales que [employer] recomendaciones. En general, se recomienda realizarse una prueba de deteccin si: Tiene entre 50 y 25 aos y un riesgo promedio de Database administrator de prstata. Debe hablar con el mdico sobre la necesidad de realizarse una prueba de deteccin y con qu frecuencia debe Futures trader. Debido a que la [employer]ley[employer]son de los cnceres de prstata crecen lentamente y no causan la muerte, generalmente las pruebas de Airline pilot se reservan en este grupo etario para los hombres que tienen una expectativa de vida de entre 10 y 15 aos. Es Adult nurse de 50 aos y tiene estos factores de riesgo: Tener un padre, un hermano o un to que han sido diagnosticados con cncer de prstata. El riesgo es mayor si el cncer de su familiar se produjo a una edad temprana o si tiene varios familiares con cncer de prstata a una edad temprana. Ser un hombre negro o de ascendencia caribea o africana subsahariana. En general, no se recomienda realizarse una prueba de deteccin si: Es Adult nurse de 40 aos. Tiene entre 40 y 32 aos y no tiene factores de Chief of Staff. Tiene 70 aos o ms. A esta edad, los riesgos que puede causar la prueba de deteccin son [employer] los beneficios que puede proporcionar. Si tiene Conservator, museum/gallery de  cncer de prstata, el mdico puede recomendarle que se realice pruebas de deteccin con ms frecuencia o que comience a realizrselas a una edad ms temprana. Cmo se realiza la prueba de deteccin del cncer de prstata? La prueba de deteccin de cncer de prstata recomendada es un anlisis de sangre llamada prueba del antgeno prosttico especfico (prostate-specific antigen, PSA). El PSA es una protena que se produce en la prstata. A medida que usted envejece, la prstata produce naturalmente ms PSA. Los niveles muy altos de PSA pueden deberse a lo siguiente: Cncer de prstata. El agrandamiento de la prstata no causado por el cncer (hiperplasia prosttica benigna, HPB). Esta afeccin es muy frecuente en hombres de edad avanzada. Infeccin de la prstata (prostatitis) o infeccin de las vas urinarias. Ciertos medicamentos, como las hormonas masculinas (testosterona) u otros medicamentos que pueden aumentar los niveles de [place]. Es posible que se realice un examen rectal como parte de una prueba de deteccin del cncer de prstata para ayudar a proporcionar informacin sobre el tamao de la prstata. Cuando se Teacher, music, debe hacerse despus de Doctor, general practice de PSA para evitar cualquier [employer]. Segn los [employer] del PSA, es posible que necesite someterse a ms pruebas, por ejemplo: Un examen fsico para verificar el tamao de la prstata, si no se realiz como parte de la prueba de deteccin. Anlisis de [place] y pruebas de diagnstico por imgenes. Un procedimiento para extraer muestras de tejido de la prstata para analizarlas (  biopsia). Esta es la nica forma de saber con certeza si tiene cncer de prstata. Cules son los beneficios de la prueba de deteccin de cncer de prstata? La prueba de deteccin puede ayudar a Ecologist en una etapa temprana, antes de que comiencen los sntomas y cuando se puede tratar con mayor  facilidad. Hay una probabilidad pequea de que la prueba de deteccin pueda reducir el riesgo de morir a causa del cncer de prstata. La posibilidad es pequea porque el cncer de prstata es un cncer de crecimiento lento y la mayora de los hombres con cncer de prstata fallecen debido a otra causa. Cules son los riesgos de la prueba de Airline pilot de cncer de prstata? El riesgo principal de la prueba de Airline pilot de cncer de prstata es el diagnstico y el tratamiento de un cncer de prstata que nunca habra causado ningn sntoma ni problema. Esto se denomina sobrediagnsticoy sobretratamiento. La prueba de deteccin del PSA no puede indicarle si su PSA es alto debido al cncer o a otra causa. Una biopsia de prstata es el nico procedimiento para Arts administrator de prstata. Es posible que incluso los resultados de una biopsia no puedan indicarle si el cncer se debe tratar. Es posible que el cncer de prstata de crecimiento lento no necesite ningn tratamiento excepto el control; por lo tanto el diagnstico y Scientist, research (medical) pueden provocar un estrs innecesario u otros efectos secundarios. Preguntas para hacerle al mdico Cundo debo comenzar a realizarme pruebas de deteccin del cncer de prstata? Cul es mi riesgo de Warehouse manager cncer de prstata? Con qu frecuencia tengo que realizarme pruebas de deteccin? Qu tipos de pruebas de deteccin necesito? Cmo obtengo los resultados de la prueba? Qu significan los resultados? Necesito tratamiento? Dnde buscar ms informacin American Cancer Society (Sociedad Americana contra el Cncer): www.cancer.org American Urological Association (Asociacin Jacques Navy): www.auanet.org Comunquese con un mdico si: Tiene dificultad para orinar. Siente dolor al Beatrix Shipper o al [employer]. Observa sangre en la orina o en el semen. Siente dolor en la espalda o en la zona de la prstata. Resumen El cncer de prstata es un tipo comn  de cncer en los hombres. La prstata se encuentra debajo de la vejiga y delante del recto. Esta glndula agrega lquido en el semen durante la eyaculacin. La prueba de deteccin del cncer de prstata puede identificar el cncer en una fase temprana, cuando el cncer puede tratarse con ms facilidad y es menos probable que se haya extendido a otras zonas del cuerpo. La prueba del antgeno prosttico especfico (PSA) es la prueba recomendada de deteccin del cncer de prstata, pero tiene riesgos asociados. Analice los riesgos y beneficios de las pruebas de Airline pilot del cncer de prstata con el mdico. Si tiene 70 aos o ms, los riesgos que puede causar la prueba de deteccin son [employer] los beneficios que puede proporcionar. Esta informacin no tiene Theme park manager el consejo del mdico. Asegrese de hacerle al mdico cualquier pregunta que tenga. Document Revised: 05/10/2021 Document Reviewed: 05/10/2021 Elsevier Patient Education  2024 [employer].

## 2023-07-02 NOTE — Telephone Encounter (Signed)
Faxed completed PA form to [employer] at [phone] for Monovisc, right knee. PA pending

## 2023-07-10 ENCOUNTER — Ambulatory Visit: Payer: 59 | Admitting: Surgical

## 2023-07-19 ENCOUNTER — Other Ambulatory Visit: Payer: Self-pay | Admitting: Family

## 2023-07-19 DIAGNOSIS — E119 Type 2 diabetes mellitus without complications: Secondary | ICD-10-CM

## 2023-07-27 ENCOUNTER — Encounter: Payer: Self-pay | Admitting: Surgical

## 2023-07-27 ENCOUNTER — Other Ambulatory Visit: Payer: Self-pay

## 2023-07-27 ENCOUNTER — Ambulatory Visit: Payer: 59 | Admitting: Surgical

## 2023-07-27 DIAGNOSIS — M1612 Unilateral primary osteoarthritis, left hip: Secondary | ICD-10-CM | POA: Diagnosis not present

## 2023-07-27 MED ORDER — BUPIVACAINE HCL 0.25 % IJ SOLN
7.0000 mL | INTRAMUSCULAR | Status: AC | PRN
Start: 2023-07-27 — End: 2023-07-27
  Administered 2023-07-27: 7 mL via INTRA_ARTICULAR

## 2023-07-27 MED ORDER — METHYLPREDNISOLONE ACETATE 40 MG/ML IJ SUSP
40.0000 mg | INTRAMUSCULAR | Status: AC | PRN
Start: 2023-07-27 — End: 2023-07-27
  Administered 2023-07-27: 40 mg via INTRA_ARTICULAR

## 2023-07-27 MED ORDER — LIDOCAINE HCL 1 % IJ SOLN
5.0000 mL | INTRAMUSCULAR | Status: AC | PRN
Start: 2023-07-27 — End: 2023-07-27
  Administered 2023-07-27: 5 mL

## 2023-07-27 NOTE — Progress Notes (Signed)
Procedure Note  Patient: Jared Foster             Date of Birth: [birth year]           MRN: [id number]             Visit Date: 07/27/2023  Procedures: Visit Diagnoses:  1. Arthritis of left hip     Large Joint Inj: L hip joint on 07/27/2023 5:01 PM Indications: pain and diagnostic evaluation Details: 22 G 3.5 in needle, ultrasound-guided anterior approach  Arthrogram: No  Medications: 5 mL lidocaine 1 %; 7 mL bupivacaine 0.25 %; 40 mg methylPREDNISolone acetate 40 MG/ML Outcome: tolerated well, no immediate complications  Patient tolerated ultrasound-guided injection well.  He had greater than 50% relief after reevaluation 5 to 10 minutes later.  Follow-up as needed for right knee gel injection and we will call him when this is approved. Procedure, treatment alternatives, risks and benefits explained, specific risks discussed. Consent was given by the patient. Immediately prior to procedure a time out was called to verify the correct patient, procedure, equipment, support staff and site/side marked as required. Patient was prepped and draped in the usual sterile fashion.

## 2023-08-17 DIAGNOSIS — E119 Type 2 diabetes mellitus without complications: Secondary | ICD-10-CM | POA: Diagnosis not present

## 2023-08-17 LAB — HM DIABETES EYE EXAM

## 2023-08-19 ENCOUNTER — Ambulatory Visit: Payer: 59 | Admitting: Orthopedic Surgery

## 2023-08-19 NOTE — Progress Notes (Signed)
noted 

## 2023-09-14 ENCOUNTER — Ambulatory Visit (INDEPENDENT_AMBULATORY_CARE_PROVIDER_SITE_OTHER): Payer: 59

## 2023-09-14 DIAGNOSIS — I739 Peripheral vascular disease, unspecified: Secondary | ICD-10-CM

## 2023-09-14 DIAGNOSIS — R0989 Other specified symptoms and signs involving the circulatory and respiratory systems: Secondary | ICD-10-CM

## 2023-09-15 NOTE — Progress Notes (Signed)
Normal findings on lower extremity ultrasound.

## 2023-09-17 ENCOUNTER — Encounter: Payer: Self-pay | Admitting: Family

## 2023-09-23 ENCOUNTER — Ambulatory Visit: Payer: 59 | Admitting: Physician Assistant

## 2023-09-23 NOTE — Progress Notes (Deleted)
Celso Amy, PA-C [address]  [address]  [place], [place] [address]  Main: [phone]  Fax: [phone]   Primary Care Physician: Mort Sawyers, FNP  Primary Gastroenterologist:  Celso Amy, PA-C / Dr. Wyline Mood    CC: Follow-up elevated LFT and NAFLD  HPI: Jared Foster Jared Foster is a 57 y.o. male who returns for 42-month follow-up of mildly elevated liver transaminase.  Here with Spanish interpreter.  Has had slightly elevated ALT liver transaminase attributed to hepatic steatosis.  All other LFTs normal.    03/20/2023 labs: Improved mildly elevated ALT 62.  All other LFTs normal.  AST 26, bilirubin 0.2, alk phos 99.  Labs 12/12/2022 showed mildly elevated ALT 88, all other LFTs normal.  Alk phos 98, AST 36, total bilirubin 0.4.  Normal CBC with hemoglobin 15.2, platelets 237. Labs 09/2022 showed mildly elevated liver transaminases with AST 40, ALT 94.  Negative acute viral hepatitis A, B, and C labs.   Patient denies abdominal pain or any other GI symptoms.  Denies alcohol use or family history liver disease.  No new medications.   RUQ ultrasound 09/2022 showed normal gallbladder, no gallstones.  There was hepatic steatosis with no liver masses.   CT abdomen pelvis with contrast 07/2022 showed no acute abnormality to explain abdominal pain.  Incidental small left groin hernia containing fat and prostamegaly.   Last colonoscopy by Dr. Tobi Bastos 08/2017 showed mild inflammation in the ascending colon, nonbleeding medium internal hemorrhoids, and no polyps.  Good prep.  Colon biopsy showed mild nonspecific focal colitis in the ascending colon.  Current Outpatient Medications  Medication Sig Dispense Refill   amLODipine (NORVASC) 5 MG tablet Take 1 tablet (5 mg total) by mouth daily. 90 tablet 3   Cetirizine HCl 10 MG CAPS Take 1 capsule (10 mg total) by mouth daily. 90 capsule 0   ciclopirox (PENLAC) 8 % solution Apply topically at bedtime. Apply over nail and  surrounding skin. Apply daily over previous coat. After seven (7) days, may remove with alcohol and continue cycle. 6.6 mL 0   fluticasone (FLONASE) 50 MCG/ACT nasal spray Place 1 spray into both nostrils 2 (two) times daily. 16 g 0   ibuprofen (ADVIL) 800 MG tablet Take 800 mg by mouth every 8 (eight) hours as needed.     metFORMIN (GLUCOPHAGE) 500 MG tablet TAKE 1 TABLET BY MOUTH TWICE A DAY WITH FOOD 90 tablet 1   No current facility-administered medications for this visit.    Allergies as of 09/23/2023   (No Known Allergies)    Past Medical History:  Diagnosis Date   Appendicitis 2003    Past Surgical History:  Procedure Laterality Date   APPENDECTOMY     COLONOSCOPY WITH PROPOFOL N/A 09/01/2017   Procedure: COLONOSCOPY WITH PROPOFOL;  Surgeon: Wyline Mood, MD;  Location[facility]COPY;  Service: Gastroenterology;  Laterality: N/A;    Review of Systems:    All systems reviewed and negative except where noted in HPI.   Physical Examination:   There were no vitals taken for this visit.  General: Well-nourished, well-developed in no acute distress.  Lungs: Clear to auscultation bilaterally. Non-labored. Heart: Regular rate and rhythm, no murmurs rubs or gallops.  Abdomen: Bowel sounds are normal; Abdomen is Soft; No hepatosplenomegaly, masses or hernias;  No Abdominal Tenderness; No guarding or rebound tenderness. Neuro: Alert and oriented x 3.  Grossly intact.  Psych: Alert and cooperative, normal mood and affect.   Imaging Studies: See  HPI.  Assessment and Plan:   Jared Foster Jared Foster is a 57 y.o. y/o male returns for f/u mildly elevated ALT liver transaminase. All other LFTs normal. Most likely due to hepatic steatosis seen on ultrasound. Viral hepatitis A, B, and C labs were negative. He does not drink alcohol. Lengthy discussion regarding fatty liver disease. Lifestyle modification discussed.   1.  Elevated liver transaminase; mild elevation  attributed to NAFLD.  Lab: CBC, CMP  2.  Hepatic steatosis  Continue low-fat diet, exercise, and weight loss.  Lab: NASH FibroSure   Unable to calculate Fibrosis 4 Score. Requires ALT, AST, and platelet count within the last 6 months.    Celso Amy, PA-C  Follow up ***  BP check ***

## 2023-11-02 ENCOUNTER — Other Ambulatory Visit: Payer: Self-pay | Admitting: Family Medicine

## 2024-01-18 ENCOUNTER — Ambulatory Visit: Admitting: Family

## 2024-01-18 ENCOUNTER — Encounter: Payer: Self-pay | Admitting: Family

## 2024-01-18 VITALS — BP 156/92 | HR 78 | Temp 98.4°F | Ht 70.0 in | Wt 224.4 lb

## 2024-01-18 DIAGNOSIS — E782 Mixed hyperlipidemia: Secondary | ICD-10-CM | POA: Diagnosis not present

## 2024-01-18 DIAGNOSIS — I10 Essential (primary) hypertension: Secondary | ICD-10-CM

## 2024-01-18 DIAGNOSIS — B351 Tinea unguium: Secondary | ICD-10-CM | POA: Insufficient documentation

## 2024-01-18 DIAGNOSIS — L600 Ingrowing nail: Secondary | ICD-10-CM

## 2024-01-18 DIAGNOSIS — M1990 Unspecified osteoarthritis, unspecified site: Secondary | ICD-10-CM | POA: Insufficient documentation

## 2024-01-18 DIAGNOSIS — A6001 Herpesviral infection of penis: Secondary | ICD-10-CM

## 2024-01-18 DIAGNOSIS — R7989 Other specified abnormal findings of blood chemistry: Secondary | ICD-10-CM | POA: Diagnosis not present

## 2024-01-18 DIAGNOSIS — Z7984 Long term (current) use of oral hypoglycemic drugs: Secondary | ICD-10-CM | POA: Diagnosis not present

## 2024-01-18 DIAGNOSIS — E119 Type 2 diabetes mellitus without complications: Secondary | ICD-10-CM

## 2024-01-18 DIAGNOSIS — L03039 Cellulitis of unspecified toe: Secondary | ICD-10-CM

## 2024-01-18 LAB — COMPREHENSIVE METABOLIC PANEL
ALT: 27 U/L (ref 0–53)
AST: 18 U/L (ref 0–37)
Albumin: 4.7 g/dL (ref 3.5–5.2)
Alkaline Phosphatase: 94 U/L (ref 39–117)
BUN: 15 mg/dL (ref 6–23)
CO2: 26 meq/L (ref 19–32)
Calcium: 10 mg/dL (ref 8.4–10.5)
Chloride: 104 meq/L (ref 96–112)
Creatinine, Ser: 0.83 mg/dL (ref 0.40–1.50)
GFR: 97.99 mL/min (ref >60.00)
Glucose, Bld: 104 mg/dL — ABNORMAL HIGH (ref 70–99)
Potassium: 4.4 meq/L (ref 3.5–5.1)
Sodium: 138 meq/L (ref 135–145)
Total Bilirubin: 0.4 mg/dL (ref 0.2–1.2)
Total Protein: 7.4 g/dL (ref 6.0–8.3)

## 2024-01-18 LAB — HEMOGLOBIN A1C: Hgb A1c MFr Bld: 6 % (ref 4.6–6.5)

## 2024-01-18 LAB — LIPID PANEL
Cholesterol: 172 mg/dL (ref 0–200)
HDL: 44.7 mg/dL (ref >39.00)
LDL Cholesterol: 93 mg/dL (ref 0–99)
NonHDL: 127.54
Total CHOL/HDL Ratio: 4
Triglycerides: 173 mg/dL — ABNORMAL HIGH (ref 0.0–149.0)
VLDL: 34.6 mg/dL (ref 0.0–40.0)

## 2024-01-18 LAB — MICROALBUMIN / CREATININE URINE RATIO
Creatinine,U: 100.1 mg/dL
Microalb Creat Ratio: 45.9 mg/g — ABNORMAL HIGH (ref 0.0–30.0)
Microalb, Ur: 4.6 mg/dL — ABNORMAL HIGH (ref 0.0–1.9)

## 2024-01-18 MED ORDER — CEPHALEXIN 500 MG PO CAPS
500.0000 mg | ORAL_CAPSULE | Freq: Two times a day (BID) | ORAL | 0 refills | Status: AC
Start: 2024-01-18 — End: 2024-01-25

## 2024-01-18 MED ORDER — IBUPROFEN 600 MG PO TABS: 600.0000 mg | ORAL_TABLET | Freq: Three times a day (TID) | ORAL | 0 refills | Status: AC | PRN

## 2024-01-18 MED ORDER — METFORMIN HCL 500 MG PO TABS: 500.0000 mg | ORAL_TABLET | Freq: Every day | ORAL | 1 refills | Status: DC

## 2024-01-18 MED ORDER — VALACYCLOVIR HCL 1 G PO TABS: 1000.0000 mg | ORAL_TABLET | Freq: Two times a day (BID) | ORAL | 0 refills | Status: AC

## 2024-01-18 NOTE — Assessment & Plan Note (Signed)
 Rx valtrex 1 g bid x 7 days

## 2024-01-18 NOTE — Assessment & Plan Note (Signed)
 Rx cephalexin 500 mg po bid x 7 days  Pt advised to:  Please monitor site for worsening signs/symptoms of infection to include: increasing redness, increasing tenderness, increase in size, and or pustulant drainage from site. If this is to occur please let me know immediately.

## 2024-01-18 NOTE — Assessment & Plan Note (Addendum)
 Doing well  Refill sent for metformin 500 mg once daily  Urine m/a ordered Foot exam completed completed in office

## 2024-01-18 NOTE — Assessment & Plan Note (Signed)
 Failure penlac Hesitant for oral meds at this time due to Lfts

## 2024-01-18 NOTE — Progress Notes (Signed)
 Established Patient Office Visit  Subjective:      CC:  Chief Complaint  Patient presents with   Medical Management of Chronic Issues    HPI: Jared Foster is a 58 y.o. male presenting on 01/18/2024 for Medical Management of Chronic Issues . DM2: been without metformin for about one week. Ran out of doses because he was overdue for f/u appt.  Lab Results  Component Value Date   HGBA1C 6.0 (A) 04/07/2023   He is requesting refill of his ibuprofen. Maybe takes once every 15 days or so when he gets really achy.   HTN: on amlodipine 5 mg once daily.   Still with toenail fungus, tried penlac without any improvement.   He states a few days ago started with itchy sensation around the penile tip and at times feels 'like water'. He states it is not painful. He thinks it looks inflamed as if it is infected. No risk for std per him has been with the same partner for 35 years.      Social history:  Relevant past medical, surgical, family and social history reviewed and updated as indicated. Interim medical history since our last visit reviewed.  Allergies and medications reviewed and updated.  DATA REVIEWED: CHART IN EPIC     ROS: Negative unless specifically indicated above in HPI.    Current Outpatient Medications:    amLODipine (NORVASC) 5 MG tablet, Take 1 tablet (5 mg total) by mouth daily., Disp: 90 tablet, Rfl: 3   cephALEXin (KEFLEX) 500 MG capsule, Take 1 capsule (500 mg total) by mouth 2 (two) times daily for 7 days., Disp: 14 capsule, Rfl: 0   Cetirizine HCl 10 MG CAPS, Take 1 capsule (10 mg total) by mouth daily., Disp: 90 capsule, Rfl: 0   ciclopirox (PENLAC) 8 % solution, Apply topically at bedtime. Apply over nail and surrounding skin. Apply daily over previous coat. After seven (7) days, may remove with alcohol and continue cycle., Disp: 6.6 mL, Rfl: 0   fluticasone (FLONASE) 50 MCG/ACT nasal spray, Place 1 spray into both nostrils 2 (two)  times daily., Disp: 16 g, Rfl: 0   ibuprofen (ADVIL) 600 MG tablet, Take 1 tablet (600 mg total) by mouth every 8 (eight) hours as needed., Disp: 30 tablet, Rfl: 0   valACYclovir (VALTREX) 1000 MG tablet, Take 1 tablet (1,000 mg total) by mouth 2 (two) times daily for 5 days., Disp: 10 tablet, Rfl: 0   metFORMIN (GLUCOPHAGE) 500 MG tablet, Take 1 tablet (500 mg total) by mouth daily., Disp: 90 tablet, Rfl: 1      Objective:    BP (!) 156/92 (BP Location: Left Arm, Patient Position: Sitting, Cuff Size: Large)   Pulse 78   Temp 98.4 F (36.9 C) (Temporal)   Ht 5\' 10"  (1.778 m)   Wt 224 lb 6.4 oz (101.8 kg)   SpO2 98%   BMI 32.20 kg/m   Wt Readings from Last 3 Encounters:  01/18/24 224 lb 6.4 oz (101.8 kg)  07/02/23 229 lb (103.9 kg)  04/07/23 229 lb 12.8 oz (104.2 kg)    Physical Exam Exam conducted with a chaperone present.  Constitutional:      General: He is not in acute distress.    Appearance: Normal appearance. He is normal weight. He is not ill-appearing, toxic-appearing or diaphoretic.  Cardiovascular:     Rate and Rhythm: Normal rate and regular rhythm.  Pulmonary:     Effort: Pulmonary effort is normal.  Genitourinary:    Penis: Uncircumcised. No tenderness or discharge.      Comments: Vesicular lesion mid shaft Pt is not circumcised  No penile infection or rash no discharge  Musculoskeletal:        General: Normal range of motion.  Feet:     Right foot:     Skin integrity: Erythema (right great toe nail bed) and warmth (very mild) present. No skin breakdown.     Toenail Condition: Right toenails are ingrown. Fungal disease present.    Left foot:     Toenail Condition: Fungal disease present. Neurological:     General: No focal deficit present.     Mental Status: He is alert and oriented to person, place, and time. Mental status is at baseline.  Psychiatric:        Mood and Affect: Mood normal.        Behavior: Behavior normal.        Thought Content:  Thought content normal.        Judgment: Judgment normal.         Wt Readings from Last 3 Encounters:  01/18/24 224 lb 6.4 oz (101.8 kg)  07/02/23 229 lb (103.9 kg)  04/07/23 229 lb 12.8 oz (104.2 kg)    Title   Diabetic Foot Exam - detailed Is there a history of foot ulcer?: No Is there a foot ulcer now?: No Is there swelling?: No Is there elevated skin temperature?: No Is there abnormal foot shape?: No Is there a claw toe deformity?: No Are the toenails long?: No Are the toenails thick?: Yes Are the toenails ingrown?: Yes Is the skin thin, fragile, shiny and hairless?": No Normal Range of Motion?: Yes Is there foot or ankle muscle weakness?: No Do you have pain in calf while walking?: No Are the shoes appropriate in style and fit?: Yes Can the patient see the bottom of their feet?: Yes Pulse Foot Exam completed.: Yes   Right Posterior Tibialis: Present Left posterior Tibialis: Present   Right Dorsalis Pedis: Present Left Dorsalis Pedis: Present     Semmes-Weinstein Monofilament Test "+" means "has sensation" and "-" means "no sensation"  R Foot Test Control: Pos L Foot Test Control: Pos   R Site 1-Great Toe: Pos L Site 1-Great Toe: Pos   R Site 4: Pos L Site 4: Pos   R site 5: Pos L Site 5: Pos  R Site 6: Pos L Site 6: Pos     Image components are not supported.   Image components are not supported. Image components are not supported.  [place] Comments         Assessment & Plan:  Controlled type 2 diabetes mellitus without complication, without long-term current use of insulin (HCC) Assessment & Plan: Doing well  Refill sent for metformin 500 mg once daily  Urine m/a ordered Foot exam completed completed in office    Orders: -     Microalbumin / creatinine urine ratio -     Hemoglobin A1c -     Ambulatory referral to Podiatry -     metFORMIN HCl; Take 1 tablet (500 mg total) by mouth daily.  Dispense: 90 tablet; Refill: 1  Mixed  hyperlipidemia -     Lipid panel  Primary hypertension  Elevated liver function tests -     Comprehensive metabolic panel  Arthritis -     Ibuprofen; Take 1 tablet (600 mg total) by mouth every 8 (eight) hours as needed.  Dispense:  30 tablet; Refill: 0  Herpes simplex infection of penis Assessment & Plan: Rx valtrex 1 g bid x 7 days   Orders: -     valACYclovir HCl; Take 1 tablet (1,000 mg total) by mouth 2 (two) times daily for 5 days.  Dispense: 10 tablet; Refill: 0  Paronychia of toe, unspecified laterality Assessment & Plan: Rx cephalexin 500 mg po bid x 7 days  Pt advised to:  Please monitor site for worsening signs/symptoms of infection to include: increasing redness, increasing tenderness, increase in size, and or pustulant drainage from site. If this is to occur please let me know immediately.    Orders: -     Cephalexin; Take 1 capsule (500 mg total) by mouth 2 (two) times daily for 7 days.  Dispense: 14 capsule; Refill: 0  Toenail fungus Assessment & Plan: Failure penlac Hesitant for oral meds at this time due to Lfts   Orders: -     Ambulatory referral to Podiatry  Ingrown right big toenail Assessment & Plan: Referral to podiatry  Cautioned on him cutting his own toenails      Return in about 6 months (around 07/20/2024) for f/u diabetes.  Mort Sawyers, MSN, APRN, FNP-C [facility]

## 2024-01-18 NOTE — Assessment & Plan Note (Signed)
 Referral to podiatry  Cautioned on him cutting his own toenails

## 2024-01-19 ENCOUNTER — Other Ambulatory Visit: Payer: Self-pay | Admitting: Family

## 2024-01-19 DIAGNOSIS — R809 Proteinuria, unspecified: Secondary | ICD-10-CM

## 2024-01-19 MED ORDER — LOSARTAN POTASSIUM 50 MG PO TABS: 50.0000 mg | ORAL_TABLET | Freq: Every day | ORAL | 1 refills | Status: DC

## 2024-01-19 NOTE — Progress Notes (Signed)
 Urine microalbumin elevated This indicates stress on kidneys without damage at current, but can cause damage if ongoing. We start a medication called losartan to hep protect the kidneys from future damage. I am sending this to your pharmacy to get started. This is caused by high blood pressure over time.  Cholesterol improving.  A1c looks great, stable at 6.0

## 2024-02-01 ENCOUNTER — Encounter: Payer: Self-pay | Admitting: Podiatry

## 2024-02-01 ENCOUNTER — Ambulatory Visit: Admitting: Podiatry

## 2024-02-01 DIAGNOSIS — Z5181 Encounter for therapeutic drug level monitoring: Secondary | ICD-10-CM | POA: Diagnosis not present

## 2024-02-01 DIAGNOSIS — B351 Tinea unguium: Secondary | ICD-10-CM | POA: Diagnosis not present

## 2024-02-01 MED ORDER — TERBINAFINE HCL 250 MG PO TABS
250.0000 mg | ORAL_TABLET | Freq: Every day | ORAL | 0 refills | Status: DC
Start: 2024-02-01 — End: 2024-05-02

## 2024-02-01 NOTE — Patient Instructions (Signed)
 VISIT SUMMARY:  Today, you were seen for nail fungus, which has been affecting the appearance of your nails. You have been using topical medication as advised by your primary care doctor and have a history of taking an oral medication for this condition. We discussed your current condition and treatment options.  YOUR PLAN:  -ONYCHOMYCOSIS (NAIL FUNGUS): Onychomycosis is a chronic fungal infection of the nails, causing them to become discolored and thickened. We have prescribed terbinafine (Lamisil) 250 mg to be taken once daily for three months. It is important to take the medication at the same time every day and not to skip any doses. We will monitor your liver function with a test in six weeks around mid April due to potential side effects. Please report any side effects, such as nausea or diarrhea, to our office. Your prescription has been sent to [facility].  INSTRUCTIONS:  Please schedule a follow-up appointment in four months to evaluate your treatment progress and any side effects. Additionally, you will need to get a liver function test around mid-April. If you experience any side effects, please call our office immediately.

## 2024-02-01 NOTE — Progress Notes (Signed)
  Subjective:  Patient ID: Jared Foster, male    DOB: November 01, 1966,  MRN: [id number]  Chief Complaint  Patient presents with   Diabetes    NP type 2 diabetes mellitus without complication, without long-term current use of insulin/Toenail fungus    Discussed the use of AI scribe software for clinical note transcription with the patient, who gave verbal consent to proceed.  History of Present Illness Jared Foster is a 58 year old male who presents with nail fungus. He was referred by his primary care doctor for evaluation of nail fungus.  He is concerned about the appearance of his nails due to the nail fungus. He has been using topical medication as advised by his primary care doctor.  He has a history of taking an oral medication for the condition, which he took twice a day, but he does not recall the name of the medication. He describes the pills as 'green pills'.  No pain when pressure is applied to the affected nails. He does not consume alcohol regularly.      Objective:    Physical Exam VASCULAR: DP and PT pulses palpable. Foot is warm and well-perfused. Capillary fill time is brisk. DERMATOLOGIC: Mycotic discolored dystrophic nails on bilateral great toes and right fifth toenail.  NEUROLOGIC: Normal sensation to light touch and pressure. No paresthesias on examination. ORTHOPEDIC: Smooth, pain-free range of motion of all examined joints. No ecchymosis or bruising. No gross deformity. No pain to palpation.        Results LABS Creatinine: normal (01/18/2024) ALT: normal (01/18/2024) AST: normal (01/18/2024) Total Protein: normal (01/18/2024) Albumin: normal (01/18/2024) GFR: normal (01/18/2024)   Assessment:   1. Onychomycosis   2. Encounter for therapeutic drug level monitoring      Plan:  Patient was evaluated and treated and all questions answered.  Assessment and Plan Assessment & Plan Onychomycosis Chronic nail  fungus confirmed by mycotic, discolored, and dystrophic nails on bilateral great toes and right fifth toenail. Previously treated with an unspecified oral medication taken twice daily, suspect this was an antibiotic for ingrowing nail. No pain upon palpation. Recent labs show normal liver and kidney function, supporting safe use of terbinafine. Monitoring liver function during treatment is advised due to potential hepatotoxicity. Common side effects, such as nausea and diarrhea, were discussed. Informed that terbinafine is taken once daily for three months at the same time each day without skipping doses. - Prescribe terbinafine (Lamisil) 250 mg once daily for 3 months. - Order liver function tests in 6 weeks to monitor for potential hepatotoxicity. - Advise to take medication at the same time every day and not to skip doses. - Instruct to report any side effects, such as nausea or diarrhea. - Send prescription to [facility].  Follow-up Requires follow-up to assess treatment efficacy and monitor for side effects while on terbinafine for three months. - Schedule follow-up appointment in four months to evaluate treatment progress and side effects. - Provide lab order for liver function tests around mid-April. - Instruct to call the office if any side effects occur.      Return in about 4 months (around 06/02/2024) for follow up after nail fungus treatment.

## 2024-04-30 ENCOUNTER — Other Ambulatory Visit: Payer: Self-pay | Admitting: Podiatry

## 2024-04-30 DIAGNOSIS — Z5181 Encounter for therapeutic drug level monitoring: Secondary | ICD-10-CM

## 2024-04-30 DIAGNOSIS — B351 Tinea unguium: Secondary | ICD-10-CM

## 2024-05-18 ENCOUNTER — Encounter: Payer: Self-pay | Admitting: Urology

## 2024-05-23 ENCOUNTER — Ambulatory Visit: Admitting: Podiatry

## 2024-06-14 ENCOUNTER — Ambulatory Visit: Admitting: Family

## 2024-06-15 ENCOUNTER — Encounter: Payer: Self-pay | Admitting: Family

## 2024-06-15 ENCOUNTER — Ambulatory Visit: Admitting: Family

## 2024-06-15 VITALS — BP 132/82 | HR 76 | Temp 99.0°F | Ht 70.0 in | Wt 221.2 lb

## 2024-06-15 DIAGNOSIS — E1142 Type 2 diabetes mellitus with diabetic polyneuropathy: Secondary | ICD-10-CM | POA: Diagnosis not present

## 2024-06-15 DIAGNOSIS — Z7984 Long term (current) use of oral hypoglycemic drugs: Secondary | ICD-10-CM

## 2024-06-15 DIAGNOSIS — R0989 Other specified symptoms and signs involving the circulatory and respiratory systems: Secondary | ICD-10-CM

## 2024-06-15 DIAGNOSIS — E782 Mixed hyperlipidemia: Secondary | ICD-10-CM | POA: Diagnosis not present

## 2024-06-15 DIAGNOSIS — E119 Type 2 diabetes mellitus without complications: Secondary | ICD-10-CM

## 2024-06-15 LAB — POCT GLYCOSYLATED HEMOGLOBIN (HGB A1C): Hemoglobin A1C: 5.6 % (ref 4.0–5.6)

## 2024-06-15 NOTE — Progress Notes (Signed)
 Established Patient Office Visit  Subjective:      CC:  Chief Complaint  Patient presents with   Diabetes    Needs a form filled out for DOT clearance, DOT physical has already been completed at [facility].    HPI: Jared Foster is a 58 y.o. male presenting on 06/15/2024 for Diabetes (Needs a form filled out for DOT clearance, DOT physical has already been completed at [facility].) .  Discussed the use of AI scribe software for clinical note transcription with the patient, who gave verbal consent to proceed.  Accompanying us  today is an interpreter from [place].   History of Present Illness Jared Foster is a 58 year old male with diabetes who presents for an early follow-up to fill out a DOT transportation paperwork and discuss peripheral neuropathy.  He feels better than ever since starting treatment. His follow-up was initially scheduled for September, but he needed to come in earlier to get assistance with filling out the form.  He is currently taking metformin  once a day, reduced from twice a day due to experiencing anxiety and increased appetite with the higher dose. No side effects are reported with the current dosage. His last A1c was 6.0, and he recently underwent a physical, vision check, blood work, and urine testing with his occupation.  No numbness, tingling, or burning sensations in his feet. He has not experienced any pins and needles or pain while walking and or pain in the calf while walking. 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  Lab Results  Component Value Date   HGBA1C 6.0 01/18/2024   On losartan  for positive urine microalbumin.          Social history:  Relevant past medical, surgical, family and social history reviewed and updated as indicated. Interim medical history since our last visit reviewed.  Allergies and medications  reviewed and updated.  DATA REVIEWED: CHART IN EPIC     ROS: Negative unless specifically indicated above in HPI.    Current Outpatient Medications:    amLODipine  (NORVASC ) 5 MG tablet, Take 1 tablet (5 mg total) by mouth daily., Disp: 90 tablet, Rfl: 3   Cetirizine  HCl 10 MG CAPS, Take 1 capsule (10 mg total) by mouth daily., Disp: 90 capsule, Rfl: 0   ciclopirox  (PENLAC ) 8 % solution, Apply topically at bedtime. Apply over nail and surrounding skin. Apply daily over previous coat. After seven (7) days, may remove with alcohol and continue cycle., Disp: 6.6 mL, Rfl: 0   fluticasone  (FLONASE ) 50 MCG/ACT nasal spray, Place 1 spray into both nostrils 2 (two) times daily., Disp: 16 g, Rfl: 0   ibuprofen  (ADVIL ) 600 MG tablet, Take 1 tablet (600 mg total) by mouth every 8 (eight) hours as needed., Disp: 30 tablet, Rfl: 0   losartan  (COZAAR ) 50 MG tablet, Take 1 tablet (50 mg total) by mouth daily., Disp: 90 tablet, Rfl: 1   metFORMIN  (GLUCOPHAGE ) 500 MG tablet, Take 1 tablet (500 mg total) by mouth daily., Disp: 90 tablet, Rfl: 1   terbinafine  (LAMISIL ) 250 MG tablet, TAKE 1 TABLET BY MOUTH EVERY DAY, Disp: 30 tablet, Rfl: 2        Objective:        BP 132/82 (BP Location: Right Arm, Patient Position: Sitting, Cuff Size: Normal)   Pulse 76   Temp 99 F (37.2 C) (Temporal)   Ht 5'  10 (1.778 m)   Wt 221 lb 3.2 oz (100.3 kg)   SpO2 98%   BMI 31.74 kg/m   Physical Exam VITALS: BP- 132/82 EXTREMITIES: Pulses palpable and strong bilaterally.  Wt Readings from Last 3 Encounters:  06/15/24 221 lb 3.2 oz (100.3 kg)  01/18/24 224 lb 6.4 oz (101.8 kg)  07/02/23 229 lb (103.9 kg)    Physical Exam  Title   Diabetic Foot Exam - detailed Visual Foot Exam completed.: Yes  Is there a history of foot ulcer?: No Is there a foot ulcer now?: No Is there swelling?: No Is there elevated skin temperature?: No Is there abnormal foot shape?: No Is there a claw toe deformity?: No Are  the toenails long?: No Are the toenails thick?: No Are the toenails ingrown?: No Is the skin thin, fragile, shiny and hairless?: No Normal Range of Motion?: Yes Is there foot or ankle muscle weakness?: No Do you have pain in calf while walking?: No Are the shoes appropriate in style and fit?: Yes Can the patient see the bottom of their feet?: Yes Pulse Foot Exam completed.: Yes   Right Posterior Tibialis: Present Left posterior Tibialis: Present   Right Dorsalis Pedis: Present Left Dorsalis Pedis: Present     Semmes-Weinstein Monofilament Test + means has sensation and - means no sensation  R Foot Test Control: Pos L Foot Test Control: Pos   R Site 1-Great Toe: Pos L Site 1-Great Toe: Pos   R Site 4: Pos L Site 4: Pos   R site 5: Pos L Site 5: Pos  R Site 6: Pos L Site 6: Pos     Image components are not supported.   Image components are not supported. Image components are not supported.  [place] Comments         Results LABS   Hemoglobin A1c: 6.0%  Assessment & Plan:   Assessment and Plan Assessment & Plan Type 2 diabetes mellitus Type 2 diabetes mellitus is well-controlled with metformin . Previously experienced anxiety and increased appetite with twice-daily dosing, now resolved with once-daily dosing. No symptoms of peripheral neuropathy such as numbness, tingling, or burning in the feet. Last A1c was 6.0, indicating prediabetic range, which is satisfactory. - Continue metformin  once daily. - Perform A1c test today to monitor diabetes control, A1c in office was 5.6 which is at goal.  - Conduct foot examination to assess for peripheral neuropathy. Which was in normal limits, unremarkable.  - Completed DOT paperwork and formal letter stating he was safe to drive from a medical standpoint.    Recording duration: 8 minutes      Return in about 6 years (around 06/15/2030) for f/u CPE.     Ginger Patrick, MSN, APRN, FNP-C [facility]

## 2024-06-20 ENCOUNTER — Ambulatory Visit: Admitting: Podiatry

## 2024-06-27 ENCOUNTER — Other Ambulatory Visit: Payer: Self-pay

## 2024-06-29 ENCOUNTER — Ambulatory Visit: Admitting: Urology

## 2024-06-30 ENCOUNTER — Ambulatory Visit: Payer: Self-pay | Admitting: Urology

## 2024-07-18 ENCOUNTER — Ambulatory Visit (INDEPENDENT_AMBULATORY_CARE_PROVIDER_SITE_OTHER): Admitting: Podiatry

## 2024-07-18 DIAGNOSIS — B351 Tinea unguium: Secondary | ICD-10-CM | POA: Diagnosis not present

## 2024-07-18 DIAGNOSIS — Z5181 Encounter for therapeutic drug level monitoring: Secondary | ICD-10-CM

## 2024-07-19 LAB — COMPREHENSIVE METABOLIC PANEL WITH GFR
ALT: 22 IU/L (ref 0–44)
AST: 16 IU/L (ref 0–40)
Albumin: 4.3 g/dL (ref 3.8–4.9)
Alkaline Phosphatase: 92 IU/L (ref 49–135)
BUN/Creatinine Ratio: 17 (ref 9–20)
BUN: 15 mg/dL (ref 6–24)
Bilirubin Total: 0.3 mg/dL (ref 0.0–1.2)
CO2: 22 mmol/L (ref 20–29)
Calcium: 9.3 mg/dL (ref 8.7–10.2)
Chloride: 107 mmol/L — ABNORMAL HIGH (ref 96–106)
Creatinine, Ser: 0.89 mg/dL (ref 0.76–1.27)
Globulin, Total: 2.3 g/dL (ref 1.5–4.5)
Glucose: 105 mg/dL — ABNORMAL HIGH (ref 70–99)
Potassium: 4.9 mmol/L (ref 3.5–5.2)
Sodium: 141 mmol/L (ref 134–144)
Total Protein: 6.6 g/dL (ref 6.0–8.5)
eGFR: 101 mL/min/1.73 (ref >59)

## 2024-07-21 ENCOUNTER — Other Ambulatory Visit: Payer: Self-pay | Admitting: Family

## 2024-07-21 DIAGNOSIS — E119 Type 2 diabetes mellitus without complications: Secondary | ICD-10-CM

## 2024-07-21 MED ORDER — TERBINAFINE HCL 250 MG PO TABS: 250.0000 mg | ORAL_TABLET | Freq: Every day | ORAL | 2 refills | Status: AC

## 2024-07-21 NOTE — Progress Notes (Signed)
  Subjective:  Patient ID: Jared Foster, male    DOB: 06-Mar-1966,  MRN: [id number]  Chief Complaint  Patient presents with   Nail Problem    Fungus. Pain in the corners    Discussed the use of AI scribe software for clinical note transcription with the patient, who gave verbal consent to proceed.  History of Present Illness Jared Foster is a 58 year old male who returns for follow-up with nail fungus. He was referred by his primary care doctor for evaluation of nail fungus.  He notes improvement has not had any side effects from the Lamisil       Objective:    Physical Exam VASCULAR: DP and PT pulses palpable. Foot is warm and well-perfused. Capillary fill time is brisk. DERMATOLOGIC: Mycotic discolored dystrophic nails on bilateral great toes and right fifth toenail.  NEUROLOGIC: Normal sensation to light touch and pressure. No paresthesias on examination. ORTHOPEDIC: Smooth, pain-free range of motion of all examined joints. No ecchymosis or bruising. No gross deformity. No pain to palpation.        Results LABS Metabolic panel on 07/19/2024 ordered by me shows normal renal and hepatic function   Assessment:   1. Onychomycosis   2. Encounter for therapeutic drug level monitoring      Plan:  Patient was evaluated and treated and all questions answered.  Assessment and Plan Assessment & Plan Onychomycosis Has had some improvement and is showing proximal clearing now has not any adverse effects from the Lamisil  and his renal and hepatic function is unchanged.  Once he completes this current course he will restart a second course.  Follow-up with me in 3 months to reevaluate.      No follow-ups on file.

## 2024-07-25 ENCOUNTER — Other Ambulatory Visit: Payer: Self-pay | Admitting: Family

## 2024-07-25 ENCOUNTER — Ambulatory Visit: Admitting: Family

## 2024-07-25 DIAGNOSIS — R809 Proteinuria, unspecified: Secondary | ICD-10-CM

## 2024-08-29 LAB — OPHTHALMOLOGY REPORT-SCANNED

## 2024-08-29 LAB — HM DIABETES EYE EXAM

## 2024-09-01 ENCOUNTER — Encounter: Payer: Self-pay | Admitting: Family

## 2024-10-24 ENCOUNTER — Ambulatory Visit: Admitting: Podiatry

## 2024-11-27 ENCOUNTER — Other Ambulatory Visit: Payer: Self-pay | Admitting: Family

## 2024-11-27 DIAGNOSIS — Z5181 Encounter for therapeutic drug level monitoring: Secondary | ICD-10-CM

## 2024-11-27 DIAGNOSIS — B351 Tinea unguium: Secondary | ICD-10-CM

## 2024-12-19 ENCOUNTER — Encounter: Admitting: Family

## 2025-06-19 ENCOUNTER — Encounter: Admitting: Family
